# Patient Record
Sex: Male | Born: 1955 | Race: White | Hispanic: No | Marital: Married | Smoking: Never smoker
Health system: Southern US, Community
[De-identification: ages and names within clinical notes are randomized; demographics above are authoritative.]

## PROBLEM LIST (undated history)

## (undated) DIAGNOSIS — I10 Essential (primary) hypertension: Secondary | ICD-10-CM

## (undated) DIAGNOSIS — E785 Hyperlipidemia, unspecified: Secondary | ICD-10-CM

## (undated) DIAGNOSIS — Z9289 Personal history of other medical treatment: Secondary | ICD-10-CM

## (undated) DIAGNOSIS — I4819 Other persistent atrial fibrillation: Secondary | ICD-10-CM

## (undated) HISTORY — DX: Essential (primary) hypertension: I10

## (undated) HISTORY — DX: Other persistent atrial fibrillation: I48.19

## (undated) HISTORY — DX: Personal history of other medical treatment: Z92.89

## (undated) HISTORY — DX: Hyperlipidemia, unspecified: E78.5

---

## 1980-11-08 HISTORY — PX: HERNIA REPAIR: SHX51

## 1999-08-09 ENCOUNTER — Emergency Department (HOSPITAL_COMMUNITY): Admission: EM | Admit: 1999-08-09 | Discharge: 1999-08-09 | Payer: Self-pay | Admitting: Emergency Medicine

## 1999-08-09 ENCOUNTER — Encounter: Payer: Self-pay | Admitting: Emergency Medicine

## 2005-03-08 ENCOUNTER — Ambulatory Visit (HOSPITAL_COMMUNITY): Admission: RE | Admit: 2005-03-08 | Discharge: 2005-03-08 | Payer: Self-pay | Admitting: Chiropractic Medicine

## 2015-12-25 ENCOUNTER — Encounter: Payer: Self-pay | Admitting: Cardiovascular Disease

## 2015-12-25 ENCOUNTER — Ambulatory Visit (INDEPENDENT_AMBULATORY_CARE_PROVIDER_SITE_OTHER): Payer: Commercial Indemnity | Admitting: Cardiovascular Disease

## 2015-12-25 VITALS — BP 160/84 | HR 109 | Ht 73.0 in | Wt 187.6 lb

## 2015-12-25 DIAGNOSIS — I10 Essential (primary) hypertension: Secondary | ICD-10-CM | POA: Diagnosis not present

## 2015-12-25 DIAGNOSIS — Z79899 Other long term (current) drug therapy: Secondary | ICD-10-CM

## 2015-12-25 DIAGNOSIS — I4891 Unspecified atrial fibrillation: Secondary | ICD-10-CM

## 2015-12-25 DIAGNOSIS — E785 Hyperlipidemia, unspecified: Secondary | ICD-10-CM | POA: Insufficient documentation

## 2015-12-25 MED ORDER — LISINOPRIL 5 MG PO TABS: 5.0000 mg | ORAL_TABLET | Freq: Every day | ORAL | Status: DC

## 2015-12-25 MED ORDER — METOPROLOL TARTRATE 25 MG PO TABS: 25.0000 mg | ORAL_TABLET | Freq: Two times a day (BID) | ORAL | Status: DC

## 2015-12-25 MED ORDER — RIVAROXABAN 20 MG PO TABS: 20.0000 mg | ORAL_TABLET | Freq: Every day | ORAL | Status: DC

## 2015-12-25 NOTE — Assessment & Plan Note (Signed)
Barry [name] has newly recognized H of fibrillation. His heart rate is in the low 100 range. He's fairly symptomatic from his however. The CHA2DSVASC2 score is 1  . I'm going to begin him on an oral anticoagulant in anticipation of outpatient cardioversion in approximately 4-6 weeks. We'll get a 2-D echocardiogram as well as a formal neurologic Myoview stress test. I'm going to add a beta blocker for rate control as well.

## 2015-12-25 NOTE — Progress Notes (Signed)
12/25/2015 Barry [name]   06-Jun-1956  [id number]  Primary Physician No primary care provider on file. Primary Cardiologist: Barry Gess MD Barry [name]   HPI:  Barry [name] is a 60 year old mildly overweight divorced Caucasian male father of 2 works at [employer] as an Art gallery manager. He was referred by Dr. Tenny [name] for newly recognized A. Fib with rapid ventricular response and hypertension. He has risk factors for heart disease including 2 hyperlipidemia and family history. He probably did see Dr. Verdis [name] several years ago for routine stress test which was negative. He has never had a heart attack or stroke. He denies chest pain or shortness of breath. He does drink 2 beers a day 5 days a week. He has newly recognized A. Fib and blood pressure which he documented home in the 150/100-110 range. He is not on any antihypertensive medications.   Current Outpatient Prescriptions  Medication Sig Dispense Refill  . aspirin 81 MG tablet Take 81 mg by mouth daily.    Marland Kitchen atorvastatin (LIPITOR) 20 MG tablet Take 20 mg by mouth daily.    Marland Kitchen ibuprofen (ADVIL,MOTRIN) 200 MG tablet Take 200 mg by mouth every 6 (six) hours as needed.     No current facility-administered medications for this visit.    No Known Allergies  Social History   Social History  . Marital Status: Married    Spouse Name: N/A  . Number of Children: N/A  . Years of Education: N/A   Occupational History  . Not on file.   Social History Main Topics  . Smoking status: Never Smoker   . Smokeless tobacco: Not on file  . Alcohol Use: 1.2 oz/week    2 Cans of beer per week  . Drug Use: No  . Sexual Activity: Not on file   Other Topics Concern  . Not on file   Social History Narrative     Review of Systems: General: negative for chills, fever, night sweats or weight changes.  Cardiovascular: negative for chest pain, dyspnea on exertion, edema, orthopnea, palpitations, paroxysmal nocturnal  dyspnea or shortness of breath Dermatological: negative for rash Respiratory: negative for cough or wheezing Urologic: negative for hematuria Abdominal: negative for nausea, vomiting, diarrhea, bright red blood per rectum, melena, or hematemesis Neurologic: negative for visual changes, syncope, or dizziness All other systems reviewed and are otherwise negative except as noted above.    Blood pressure 160/84, pulse 109, height  (1.854 m), weight 187 lb 9.6 oz (85.095 kg).  General appearance: alert and no distress Neck: no adenopathy, no carotid bruit, no JVD, supple, symmetrical, trachea midline and thyroid not enlarged, symmetric, no tenderness/mass/nodules Lungs: clear to auscultation bilaterally Heart: regular rate and rhythm, S1, S2 normal, no murmur, click, rub or gallop Extremities: extremities normal, atraumatic, no cyanosis or edema  EKG H the fibrillation with a ventricular response of 109 and nonspecific ST and T-wave changes. I personally reviewed this EKG  ASSESSMENT AND PLAN:   Essential hypertension History of hypertension blood pressures measured at home in the 140 150/100-120 range. He currently is not on any antihypertensive medications. I'm going to begin him on Lopressor 25 mg by mouth twice a day and lisinopril 5 mg a day. We will check a basic panel in 3 weeks. Unless indicated a blood pressure log and I will see him back in one month for follow-up.  Hyperlipidemia History of hyperlipidemia on atorvastatin with recent lipid profile performed by his PCP 07/31/15 revealed a  total cholesterol of 42, LDL 78 and HDL of 38.  Atrial fibrillation Barry [name]) Barry. [name] has newly recognized H of fibrillation. His heart rate is in the low 100 range. He's fairly symptomatic from his however. The CHA2DSVASC2 score is 1  . I'm going to begin him on an oral anticoagulant in anticipation of outpatient cardioversion in approximately 4-6 weeks. We'll get a 2-D echocardiogram as well  as a formal neurologic Myoview stress test. I'm going to add a beta blocker for rate control as well.      Barry Gess MD FACP,FACC,FAHA, [facility] [name] 12/25/2015 3:32 PM

## 2015-12-25 NOTE — Patient Instructions (Addendum)
Your physician has recommended you make the following change in your medication:   START METOPROLOL TART 25 MG TWICE DAILY  START LISINOPRIL 5 MG DAILY  START XARELTO 20 MG DAILY  STOP ASPIRIN   Your physician has requested that you have an echocardiogram. Echocardiography is a painless test that uses sound waves to create images of your heart. It provides your doctor with information about the size and shape of your heart and how well your heart's chambers and valves are working. This procedure takes approximately one hour. There are no restrictions for this procedure.  Your physician has requested that you have a lexiscan myoview. For further information please visit [email or web address] follow instruction sheet, as given.  Your physician recommends that you return for lab work in: 3 WEEKS AT [facility]  (January 15, 2015).  Your physician recommends that you schedule a follow-up appointment in: ONE MONTH WITH DR. Allyson Sabal

## 2015-12-25 NOTE — Assessment & Plan Note (Signed)
History of hypertension blood pressures measured at home in the 140 150/100-120 range. He currently is not on any antihypertensive medications. I'm going to begin him on Lopressor 25 mg by mouth twice a day and lisinopril 5 mg a day. We will check a basic panel in 3 weeks. Unless indicated a blood pressure log and I will see him back in one month for follow-up.

## 2015-12-25 NOTE — Assessment & Plan Note (Signed)
History of hyperlipidemia on atorvastatin with recent lipid profile performed by his PCP 07/31/15 revealed a total cholesterol of 42, LDL 78 and HDL of 38.

## 2015-12-31 ENCOUNTER — Telehealth (HOSPITAL_COMMUNITY): Payer: Self-pay

## 2015-12-31 NOTE — Telephone Encounter (Signed)
Encounter complete. 

## 2016-01-02 ENCOUNTER — Ambulatory Visit (HOSPITAL_COMMUNITY)
Admission: RE | Admit: 2016-01-02 | Discharge: 2016-01-02 | Disposition: A | Discharge status: Home / Self Care | Payer: Commercial Indemnity | Source: Ambulatory Visit | Attending: Cardiology | Admitting: Cardiology

## 2016-01-02 ENCOUNTER — Ambulatory Visit (HOSPITAL_BASED_OUTPATIENT_CLINIC_OR_DEPARTMENT_OTHER)
Admission: RE | Admit: 2016-01-02 | Discharge: 2016-01-02 | Disposition: A | Discharge status: Home / Self Care | Payer: Commercial Indemnity | Source: Ambulatory Visit | Attending: Cardiology | Admitting: Cardiology

## 2016-01-02 DIAGNOSIS — I4891 Unspecified atrial fibrillation: Secondary | ICD-10-CM | POA: Insufficient documentation

## 2016-01-02 DIAGNOSIS — E785 Hyperlipidemia, unspecified: Secondary | ICD-10-CM | POA: Insufficient documentation

## 2016-01-02 DIAGNOSIS — I071 Rheumatic tricuspid insufficiency: Secondary | ICD-10-CM | POA: Diagnosis not present

## 2016-01-02 DIAGNOSIS — I351 Nonrheumatic aortic (valve) insufficiency: Secondary | ICD-10-CM | POA: Insufficient documentation

## 2016-01-02 DIAGNOSIS — R079 Chest pain, unspecified: Secondary | ICD-10-CM | POA: Insufficient documentation

## 2016-01-02 DIAGNOSIS — R42 Dizziness and giddiness: Secondary | ICD-10-CM | POA: Insufficient documentation

## 2016-01-02 DIAGNOSIS — I119 Hypertensive heart disease without heart failure: Secondary | ICD-10-CM | POA: Diagnosis not present

## 2016-01-02 DIAGNOSIS — R002 Palpitations: Secondary | ICD-10-CM | POA: Insufficient documentation

## 2016-01-02 DIAGNOSIS — I371 Nonrheumatic pulmonary valve insufficiency: Secondary | ICD-10-CM | POA: Diagnosis not present

## 2016-01-02 DIAGNOSIS — R9439 Abnormal result of other cardiovascular function study: Secondary | ICD-10-CM | POA: Diagnosis not present

## 2016-01-02 DIAGNOSIS — I77810 Thoracic aortic ectasia: Secondary | ICD-10-CM | POA: Insufficient documentation

## 2016-01-02 DIAGNOSIS — Z8249 Family history of ischemic heart disease and other diseases of the circulatory system: Secondary | ICD-10-CM | POA: Diagnosis not present

## 2016-01-02 LAB — MYOCARDIAL PERFUSION IMAGING
CHL CUP NUCLEAR SDS: 3
CHL CUP NUCLEAR SSS: 6
CSEPPHR: 130 {beats}/min
LVDIAVOL: 165 mL
LVSYSVOL: 94 mL
Rest HR: 107 {beats}/min
SRS: 3
TID: 1.18

## 2016-01-02 MED ORDER — AMINOPHYLLINE 25 MG/ML IV SOLN: 75.0000 mg | Freq: Once | INTRAVENOUS | Status: DC

## 2016-01-02 MED ORDER — TECHNETIUM TC 99M SESTAMIBI GENERIC - CARDIOLITE: 10.5000 | Freq: Once | INTRAVENOUS | Status: DC | PRN

## 2016-01-02 MED ORDER — REGADENOSON 0.4 MG/5ML IV SOLN: 0.4000 mg | Freq: Once | INTRAVENOUS | Status: DC

## 2016-01-02 MED ORDER — TECHNETIUM TC 99M SESTAMIBI GENERIC - CARDIOLITE: 30.1000 | Freq: Once | INTRAVENOUS | Status: DC | PRN

## 2016-01-08 ENCOUNTER — Encounter: Payer: Self-pay | Admitting: Cardiovascular Disease

## 2016-01-16 LAB — BASIC METABOLIC PANEL
BUN: 26 mg/dL — ABNORMAL HIGH (ref 7–25)
CHLORIDE: 105 mmol/L (ref 98–110)
CO2: 22 mmol/L (ref 20–31)
Calcium: 9.3 mg/dL (ref 8.6–10.3)
Creat: 0.96 mg/dL (ref 0.70–1.33)
GLUCOSE: 127 mg/dL — AB (ref 65–99)
Potassium: 4.6 mmol/L (ref 3.5–5.3)
SODIUM: 138 mmol/L (ref 135–146)

## 2016-01-27 ENCOUNTER — Ambulatory Visit: Payer: Commercial Indemnity | Admitting: Cardiovascular Disease

## 2016-02-13 ENCOUNTER — Encounter: Payer: Self-pay | Admitting: Cardiovascular Disease

## 2016-02-13 ENCOUNTER — Ambulatory Visit (INDEPENDENT_AMBULATORY_CARE_PROVIDER_SITE_OTHER): Payer: Commercial Indemnity | Admitting: Cardiovascular Disease

## 2016-02-13 VITALS — BP 150/78 | HR 70 | Ht 73.0 in | Wt 181.0 lb

## 2016-02-13 DIAGNOSIS — I10 Essential (primary) hypertension: Secondary | ICD-10-CM | POA: Diagnosis not present

## 2016-02-13 DIAGNOSIS — I4819 Other persistent atrial fibrillation: Secondary | ICD-10-CM

## 2016-02-13 DIAGNOSIS — E785 Hyperlipidemia, unspecified: Secondary | ICD-10-CM | POA: Diagnosis not present

## 2016-02-13 DIAGNOSIS — I481 Persistent atrial fibrillation: Secondary | ICD-10-CM

## 2016-02-13 LAB — CBC WITH DIFFERENTIAL/PLATELET
BASOS ABS: 0 {cells}/uL (ref 0–200)
BASOS PCT: 0 %
EOS ABS: 146 {cells}/uL (ref 15–500)
Eosinophils Relative: 2 %
HEMATOCRIT: 45.2 % (ref 38.5–50.0)
Hemoglobin: 15.3 g/dL (ref 13.2–17.1)
LYMPHS PCT: 26 %
Lymphs Abs: 1898 cells/uL (ref 850–3900)
MCH: 29.8 pg (ref 27.0–33.0)
MCHC: 33.8 g/dL (ref 32.0–36.0)
MCV: 88.1 fL (ref 80.0–100.0)
MONO ABS: 584 {cells}/uL (ref 200–950)
MONOS PCT: 8 %
MPV: 10.7 fL (ref 7.5–12.5)
NEUTROS PCT: 64 %
Neutro Abs: 4672 cells/uL (ref 1500–7800)
PLATELETS: 171 10*3/uL (ref 140–400)
RBC: 5.13 MIL/uL (ref 4.20–5.80)
RDW: 13.8 % (ref 11.0–15.0)
WBC: 7.3 10*3/uL (ref 3.8–10.8)

## 2016-02-13 NOTE — Progress Notes (Signed)
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 He was referred by Dr. Duane [name] for newly recognized A. Fib with rapid ventricular response and hypertension. I last saw him in the office 12/25/15 He has risk factors for heart disease including 2 hyperlipidemia and family history. He probably did see Dr. Verdis [name] several years ago for routine stress test which was negative. He has never had a heart attack or stroke. He denies chest pain or shortness of breath. He does drink 2 beers a day 5 days a week. He has cut down his alcohol intake since his last office visit.He has newly recognized A. Fib and blood pressure which he documented home in the 150/100-110 range. I began him on metoprolol and lisinopril at his last office visit for rate control and blood pressure control. He does feel clinically improved. His blood pressure is improved as well as heart rate has come down to the 70 range. He has been on xarelto for the last 6-7 weeks at my request and anticipation of elective outpatient cardioversion. A Myoview stress test was low risk and a 2-D echo revealed normal LV systolic function, concentric left hypertrophy and severe left atrial enlargement making the possibility of cardioversion more difficult.   Current Outpatient Prescriptions  Medication Sig Dispense Refill  . atorvastatin (LIPITOR) 20 MG tablet Take 20 mg by mouth daily.    Marland Kitchen ibuprofen (ADVIL,MOTRIN) 200 MG tablet Take 200 mg by mouth every 6 (six) hours as needed.    Marland Kitchen lisinopril (PRINIVIL,ZESTRIL) 5 MG tablet Take 1 tablet (5 mg total) by mouth daily. 30 tablet 5  . metoprolol tartrate (LOPRESSOR) 25 MG tablet Take 1 tablet (25 mg total) by mouth 2 (two) times daily. 60 tablet 5  .  rivaroxaban (XARELTO) 20 MG TABS tablet Take 1 tablet (20 mg total) by mouth daily with supper. 30 tablet 5   No current facility-administered medications for this visit.    No Known Allergies  Social History   Social History  . Marital Status: Married    Spouse Name: N/A  . Number of Children: N/A  . Years of Education: N/A   Occupational History  . Not on file.   Social History Main Topics  . Smoking status: Never Smoker   . Smokeless tobacco: Not on file  . Alcohol Use: 1.2 oz/week    2 Cans of beer per week  . Drug Use: No  . Sexual Activity: Not on file   Other Topics Concern  . Not on file   Social History Narrative     Review of Systems: General: negative for chills, fever, night sweats or weight changes.  Cardiovascular: negative for chest pain, dyspnea on exertion, edema, orthopnea, palpitations, paroxysmal nocturnal dyspnea or shortness of breath Dermatological: negative for rash Respiratory: negative for cough or wheezing Urologic: negative for hematuria Abdominal: negative for nausea, vomiting, diarrhea, bright red blood per rectum, melena, or hematemesis Neurologic: negative for visual changes, syncope, or dizziness All other systems reviewed and are otherwise negative except as noted above.    Blood pressure 150/78, pulse 70, height  (1.854 m), weight 181 lb (82.101 kg).  General appearance: alert and no distress Neck: no adenopathy, no carotid bruit, no JVD, supple, symmetrical, trachea  midline and thyroid not enlarged, symmetric, no tenderness/mass/nodules Lungs: clear to auscultation bilaterally Heart: regular rate and rhythm, S1, S2 normal, no murmur, click, rub or gallop Extremities: extremities normal, atraumatic, no cyanosis or edema  EKG not performed today  ASSESSMENT AND PLAN:   Essential hypertension History of hypertension with blood pressure measured at 150/78 although at home when he measures it himself at slightly lower than  this. He is on metoprolol and lisinopril. Continue current meds at current dosing  Hyperlipidemia History of hyperlipidemia on statin therapy followed by his PCP  Atrial fibrillation (HCC) History of persistent atrial fibrillation first demonstrated approximately 2 months ago. Here a 2-D echo that showed moderate concentric left ventricle. Hypertrophy with severe L a dilatation. A Myoview stress test was low risk. He has been on  Xarelto  oral anticoagulation for the  last 6-7 weeks. I'm going to arrange for him to undergo outpatient DC cardioversion. He is relatively asymptomatic from his A. Fib. If he does not maintain sinus rhythm after successful cardioversion settle for rate control.      Barry GessJonathan J. Geordie Nooney MD FACP,FACC,FAHA, [facility] 02/13/2016 10:09 AM

## 2016-02-13 NOTE — Assessment & Plan Note (Signed)
History of hypertension with blood pressure measured at 150/78 although at home when he measures it himself at slightly lower than this. He is on metoprolol and lisinopril. Continue current meds at current dosing

## 2016-02-13 NOTE — Assessment & Plan Note (Signed)
History of hyperlipidemia on statin therapy followed by his PCP 

## 2016-02-13 NOTE — Patient Instructions (Signed)
Medication Instructions:  Your physician recommends that you continue on your current medications as directed. Please refer to the Current Medication list given to you today.     Testing/Procedures: Your physician has recommended that you have a Cardioversion (DCCV). Electrical Cardioversion uses a jolt of electricity to your heart either through paddles or wired patches attached to your chest. This is a controlled, usually prescheduled, procedure. Defibrillation is done under light anesthesia in the hospital, and you usually go home the day of the procedure. This is done to get your heart back into a normal rhythm. You are not awake for the procedure. Please see the instruction sheet given to you today. SCHEDULE WITH DR. Elease HashimotoNAHSER ON 4/12    Follow-Up: Your physician recommends that you schedule a follow-up appointment in: 4 WEEKS POST CARDIOVERSION WITH DR. Allyson SabalBERRY    Any Other Special Instructions Will Be Listed Below (If Applicable).     If you need a refill on your cardiac medications before your next appointment, please call your pharmacy.

## 2016-02-13 NOTE — Assessment & Plan Note (Signed)
History of persistent atrial fibrillation first demonstrated approximately 2 months ago. Here a 2-D echo that showed moderate concentric left ventricle. Hypertrophy with severe L a dilatation. A Myoview stress test was low risk. He has been on  Xarelto  oral anticoagulation for the  last 6-7 weeks. I'm going to arrange for him to undergo outpatient DC cardioversion. He is relatively asymptomatic from his A. Fib. If he does not maintain sinus rhythm after successful cardioversion settle for rate control.

## 2016-02-14 LAB — URINALYSIS
BILIRUBIN URINE: NEGATIVE
GLUCOSE, UA: NEGATIVE
HGB URINE DIPSTICK: NEGATIVE
Ketones, ur: NEGATIVE
LEUKOCYTES UA: NEGATIVE
Nitrite: NEGATIVE
PH: 5 (ref 5.0–8.0)
PROTEIN: NEGATIVE
Specific Gravity, Urine: 1.027 (ref 1.001–1.035)

## 2016-02-18 ENCOUNTER — Encounter (HOSPITAL_COMMUNITY): Payer: Self-pay | Admitting: *Deleted

## 2016-02-18 ENCOUNTER — Encounter (HOSPITAL_COMMUNITY)
Admission: RE | Disposition: A | Discharge status: Home / Self Care | Payer: Self-pay | Source: Ambulatory Visit | Attending: Cardiovascular Disease

## 2016-02-18 ENCOUNTER — Ambulatory Visit (HOSPITAL_COMMUNITY)
Admission: RE | Admit: 2016-02-18 | Discharge: 2016-02-18 | Disposition: A | Discharge status: Home / Self Care | Payer: Managed Care, Other (non HMO) | Source: Ambulatory Visit | Attending: Cardiovascular Disease | Admitting: Cardiovascular Disease

## 2016-02-18 ENCOUNTER — Ambulatory Visit (HOSPITAL_COMMUNITY): Payer: Managed Care, Other (non HMO) | Admitting: Certified Registered"

## 2016-02-18 DIAGNOSIS — I10 Essential (primary) hypertension: Secondary | ICD-10-CM | POA: Diagnosis not present

## 2016-02-18 DIAGNOSIS — I481 Persistent atrial fibrillation: Secondary | ICD-10-CM

## 2016-02-18 DIAGNOSIS — Z7901 Long term (current) use of anticoagulants: Secondary | ICD-10-CM | POA: Insufficient documentation

## 2016-02-18 DIAGNOSIS — E785 Hyperlipidemia, unspecified: Secondary | ICD-10-CM | POA: Diagnosis not present

## 2016-02-18 DIAGNOSIS — I4819 Other persistent atrial fibrillation: Secondary | ICD-10-CM | POA: Insufficient documentation

## 2016-02-18 DIAGNOSIS — I4891 Unspecified atrial fibrillation: Secondary | ICD-10-CM | POA: Diagnosis present

## 2016-02-18 HISTORY — PX: CARDIOVERSION: SHX1299

## 2016-02-18 SURGERY — CARDIOVERSION
Anesthesia: General

## 2016-02-18 MED ORDER — LIDOCAINE HCL (CARDIAC) 20 MG/ML IV SOLN
INTRAVENOUS | Status: DC | PRN
  Administered 2016-02-18: 20 mg via INTRAVENOUS

## 2016-02-18 MED ORDER — SODIUM CHLORIDE 0.9 % IV SOLN
INTRAVENOUS | Status: DC
Start: 2016-02-18 — End: 2016-02-18
  Administered 2016-02-18: 500 mL via INTRAVENOUS

## 2016-02-18 MED ORDER — LIDOCAINE HCL (CARDIAC) 20 MG/ML IV SOLN
INTRAVENOUS | Status: AC
  Filled 2016-02-18: qty 5

## 2016-02-18 MED ORDER — PROPOFOL 10 MG/ML IV BOLUS
INTRAVENOUS | Status: DC | PRN
  Administered 2016-02-18: 110 mg via INTRAVENOUS

## 2016-02-18 MED ORDER — PROPOFOL 10 MG/ML IV BOLUS
INTRAVENOUS | Status: AC
  Filled 2016-02-18: qty 20

## 2016-02-18 NOTE — Discharge Instructions (Signed)
Electrical Cardioversion, Care After °Refer to this sheet in the next few weeks. These instructions provide you with information on caring for yourself after your procedure. Your health care provider may also give you more specific instructions. Your treatment has been planned according to current medical practices, but problems sometimes occur. Call your health care provider if you have any problems or questions after your procedure. °WHAT TO EXPECT AFTER THE PROCEDURE °After your procedure, it is typical to have the following sensations: °· Some redness on the skin where the shocks were delivered. If this is tender, a sunburn lotion or hydrocortisone cream may help. °· Possible return of an abnormal heart rhythm within hours or days after the procedure. °HOME CARE INSTRUCTIONS °· Take medicines only as directed by your health care provider. Be sure you understand how and when to take your medicine. °· Learn how to feel your pulse and check it often. °· Limit your activity for 48 hours after the procedure or as directed by your health care provider. °· Avoid or minimize caffeine and other stimulants as directed by your health care provider. °SEEK MEDICAL CARE IF: °· You feel like your heart is beating too fast or your pulse is not regular. °· You have any questions about your medicines. °· You have bleeding that will not stop. °SEEK IMMEDIATE MEDICAL CARE IF: °· You are dizzy or feel faint. °· It is hard to breathe or you feel short of breath. °· There is a change in discomfort in your chest. °· Your speech is slurred or you have trouble moving an arm or leg on one side of your body. °· You get a serious muscle cramp that does not go away. °· Your fingers or toes turn cold or blue. °  °This information is not intended to replace advice given to you by your health care provider. Make sure you discuss any questions you have with your health care provider. °  °Document Released: 08/15/2013 Document Revised: 11/15/2014  Document Reviewed: 08/15/2013 °Elsevier Interactive Patient Education ©2016 [employer] ° °

## 2016-02-18 NOTE — Interval H&P Note (Signed)
History and Physical Interval Note:  02/18/2016 12:02 PM  Barry [name]  has presented today for surgery, with the diagnosis of afib  The various methods of treatment have been discussed with the patient and family. After consideration of risks, benefits and other options for treatment, the patient has consented to  Procedure(s): CARDIOVERSION (N/A) as a surgical intervention .  The patient's history has been reviewed, patient examined, [name] change in status, stable for surgery.  I have reviewed the patient's chart and labs.  Questions were answered to the patient's satisfaction.     Jordanny Waddington, Deloris PingPhilip J

## 2016-02-18 NOTE — Anesthesia Postprocedure Evaluation (Signed)
Anesthesia Post Note  Patient: Barry [name]  Procedure(s) Performed: Procedure(s) (LRB): CARDIOVERSION (N/A)  Patient location during evaluation: Endoscopy Anesthesia Type: General Level of consciousness: awake, awake and alert and oriented Pain management: pain level controlled Vital Signs Assessment: post-procedure vital signs reviewed and stable Respiratory status: spontaneous breathing, nonlabored ventilation and respiratory function stable Anesthetic complications: no    Last Vitals:  Filed Vitals:   02/18/16 1230 02/18/16 1240  BP: 131/71 115/76  Pulse: 74 66  Temp:    Resp: 13 19    Last Pain: There were no vitals filed for this visit.               Kynzee Devinney COKER

## 2016-02-18 NOTE — Transfer of Care (Signed)
Immediate Anesthesia Transfer of Care Note  Patient: Barry [name]  Procedure(s) Performed: Procedure(s): CARDIOVERSION (N/A)  Patient Location: Endoscopy Unit  Anesthesia Type:General  Level of Consciousness: awake, oriented and patient cooperative  Airway & Oxygen Therapy: Patient Spontanous Breathing and Patient connected to nasal cannula oxygen  Post-op Assessment: Report given to RN, Post -op Vital signs reviewed and stable and Patient moving all extremities  Post vital signs: Reviewed and stable  Last Vitals:  Filed Vitals:   02/18/16 1127  BP: 134/94  Temp: 36.5 C  Resp: 13    Complications: No apparent anesthesia complications

## 2016-02-18 NOTE — CV Procedure (Signed)
    Cardioversion Note  Chuck HintMark B Kerins [id number] 01/09/1956  Procedure: DC Cardioversion Indications: atrial fib   Procedure Details Consent: Obtained Time Out: Verified patient identification, verified procedure, site/side was marked, verified correct patient position, special equipment/implants available, Radiology Safety Procedures followed,  medications/allergies/relevent history reviewed, required imaging and test results available.  Performed  The patient has been on adequate anticoagulation.  The patient received IV Lidocaine 20 mg followed by Propofol 110 mg IV  for sedation.  Synchronous cardioversion was performed at 120  joules.  The cardioversion was successful     Complications: No apparent complications Patient did tolerate procedure well.   Vesta MixerPhilip J. Xan Ingraham, Montez HagemanJr., MD, [facility] 02/18/2016, 12:13 PM

## 2016-02-18 NOTE — H&P (View-Only) (Signed)
   02/13/2016 Barry [name]   07/16/1956  [id number]  Primary Physician  Ross, Alan, MD Primary Cardiologist: Kazaria Gaertner J. 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 He was referred by Dr. Alan Ross for newly recognized A. Fib with rapid ventricular response and hypertension. I last saw him in the office 12/25/15 He has risk factors for heart disease including 2 hyperlipidemia and family history. He probably did see Dr. Henry Smith several years ago for routine stress test which was negative. He has never had a heart attack or stroke. He denies chest pain or shortness of breath. He does drink 2 beers a day 5 days a week. He has cut down his alcohol intake since his last office visit.He has newly recognized A. Fib and blood pressure which he documented home in the 150/100-110 range. I began him on metoprolol and lisinopril at his last office visit for rate control and blood pressure control. He does feel clinically improved. His blood pressure is improved as well as heart rate has come down to the 70 range. He has been on xarelto for the last 6-7 weeks at my request and anticipation of elective outpatient cardioversion. A Myoview stress test was low risk and a 2-D echo revealed normal LV systolic function, concentric left hypertrophy and severe left atrial enlargement making the possibility of cardioversion more difficult.   Current Outpatient Prescriptions  Medication Sig Dispense Refill  . atorvastatin (LIPITOR) 20 MG tablet Take 20 mg by mouth daily.    . ibuprofen (ADVIL,MOTRIN) 200 MG tablet Take 200 mg by mouth every 6 (six) hours as needed.    . lisinopril (PRINIVIL,ZESTRIL) 5 MG tablet Take 1 tablet (5 mg total) by mouth daily. 30 tablet 5  . metoprolol tartrate (LOPRESSOR) 25 MG tablet Take 1 tablet (25 mg total) by mouth 2 (two) times daily. 60 tablet 5  .  rivaroxaban (XARELTO) 20 MG TABS tablet Take 1 tablet (20 mg total) by mouth daily with supper. 30 tablet 5   No current facility-administered medications for this visit.    No Known Allergies  Social History   Social History  . Marital Status: Married    Spouse Name: N/A  . Number of Children: N/A  . Years of Education: N/A   Occupational History  . Not on file.   Social History Main Topics  . Smoking status: Never Smoker   . Smokeless tobacco: Not on file  . Alcohol Use: 1.2 oz/week    2 Cans of beer per week  . Drug Use: No  . Sexual Activity: Not on file   Other Topics Concern  . Not on file   Social History Narrative     Review of Systems: General: negative for chills, fever, night sweats or weight changes.  Cardiovascular: negative for chest pain, dyspnea on exertion, edema, orthopnea, palpitations, paroxysmal nocturnal dyspnea or shortness of breath Dermatological: negative for rash Respiratory: negative for cough or wheezing Urologic: negative for hematuria Abdominal: negative for nausea, vomiting, diarrhea, bright red blood per rectum, melena, or hematemesis Neurologic: negative for visual changes, syncope, or dizziness All other systems reviewed and are otherwise negative except as noted above.    Blood pressure 150/78, pulse 70, height 6' 1" (1.854 m), weight 181 lb (82.101 kg).  General appearance: alert and no distress Neck: no adenopathy, no carotid bruit, no JVD, supple, symmetrical, trachea   midline and thyroid not enlarged, symmetric, no tenderness/mass/nodules Lungs: clear to auscultation bilaterally Heart: regular rate and rhythm, S1, S2 normal, no murmur, click, rub or gallop Extremities: extremities normal, atraumatic, no cyanosis or edema  EKG not performed today  ASSESSMENT AND PLAN:   Essential hypertension History of hypertension with blood pressure measured at 150/78 although at home when he measures it himself at slightly lower than  this. He is on metoprolol and lisinopril. Continue current meds at current dosing  Hyperlipidemia History of hyperlipidemia on statin therapy followed by his PCP  Atrial fibrillation (HCC) History of persistent atrial fibrillation first demonstrated approximately 2 months ago. Here a 2-D echo that showed moderate concentric left ventricle. Hypertrophy with severe L a dilatation. A Myoview stress test was low risk. He has been on  Xarelto  oral anticoagulation for the  last 6-7 weeks. I'm going to arrange for him to undergo outpatient DC cardioversion. He is relatively asymptomatic from his A. Fib. If he does not maintain sinus rhythm after successful cardioversion settle for rate control.      Alasia Enge J. Teasia Zapf MD FACP,FACC,FAHA, FSCAI 02/13/2016 10:09 AM  

## 2016-02-18 NOTE — Anesthesia Preprocedure Evaluation (Addendum)
Anesthesia Evaluation  Patient identified by MRN, date of birth, ID band Patient awake    Reviewed: Allergy & Precautions, NPO status , Patient's Chart, lab work & pertinent test results  Airway Mallampati: II  TM Distance: >3 FB Neck ROM: Full    Dental  (+) Teeth Intact, Dental Advisory Given   Pulmonary    breath sounds clear to auscultation       Cardiovascular hypertension,  Rhythm:Irregular Rate:Normal     Neuro/Psych    GI/Hepatic   Endo/Other    Renal/GU      Musculoskeletal   Abdominal   Peds  Hematology   Anesthesia Other Findings   Reproductive/Obstetrics                            Anesthesia Physical Anesthesia Plan  ASA: III  Anesthesia Plan: General   Post-op Pain Management:    Induction: Intravenous  Airway Management Planned: Mask  Additional Equipment: None  Intra-op Plan:   Post-operative Plan:   Informed Consent: I have reviewed the patients History and Physical, chart, labs and discussed the procedure including the risks, benefits and alternatives for the proposed anesthesia with the patient or authorized representative who has indicated his/her understanding and acceptance.   Dental advisory given  Plan Discussed with: CRNA, Anesthesiologist and Surgeon  Anesthesia Plan Comments:         Anesthesia Quick Evaluation

## 2016-02-19 ENCOUNTER — Encounter (HOSPITAL_COMMUNITY): Payer: Self-pay | Admitting: Cardiovascular Disease

## 2016-04-21 ENCOUNTER — Ambulatory Visit (INDEPENDENT_AMBULATORY_CARE_PROVIDER_SITE_OTHER): Payer: Managed Care, Other (non HMO) | Admitting: Nurse Practitioner

## 2016-04-21 ENCOUNTER — Encounter: Payer: Self-pay | Admitting: Nurse Practitioner

## 2016-04-21 VITALS — BP 120/82 | HR 91 | Ht 73.0 in | Wt 185.0 lb

## 2016-04-21 DIAGNOSIS — I10 Essential (primary) hypertension: Secondary | ICD-10-CM | POA: Diagnosis not present

## 2016-04-21 DIAGNOSIS — E785 Hyperlipidemia, unspecified: Secondary | ICD-10-CM

## 2016-04-21 DIAGNOSIS — I481 Persistent atrial fibrillation: Secondary | ICD-10-CM | POA: Diagnosis not present

## 2016-04-21 DIAGNOSIS — I4819 Other persistent atrial fibrillation: Secondary | ICD-10-CM

## 2016-04-21 NOTE — Patient Instructions (Signed)
You have been referred to: [facility] with Dr Elberta Fortisamnitz

## 2016-04-21 NOTE — Progress Notes (Signed)
Office Visit    Patient Name: Barry [name] Date of Encounter: 04/21/2016  Primary Care Provider:   Duane Lopeoss, Alan, MD Primary Cardiologist:  Erlene QuanJ. Berry, MD   Chief Complaint    60 year old male with a history of atrial fibrillation who presents for follow-up after recent cardioversion.  Past Medical History    Past Medical History  Diagnosis Date  . Hyperlipidemia   . Essential hypertension   . Persistent atrial fibrillation (HCC)     a. Dx 12/2012; b. CHA2DS2VASc = 1-->Xarelto;  c. 12/2015 Echo: EF 50-55%, no rwma, mild AI, mildly dil Asc AO, triv MR/TR, sev dil LA, mild PI;  d. 02/2016 Successful DCCV w/ [id number]; e. 04/2016 Recurrent AF.  Marland Kitchen. H/O cardiovascular stress test     a. 12/2015 MV: EF 43% (nl by echo), small basal & mid inferoseptal defect - likely attenuation artifact.   Past Surgical History  Procedure Laterality Date  . Hernia repair  1982  . Cardioversion N/A 02/18/2016    Procedure: CARDIOVERSION;  Surgeon: Vesta MixerPhilip J Nahser, MD;  [facility];  Service: Cardiovascular;  Laterality: N/A;    Allergies  No Known Allergies  History of Present Illness    60 year old male with a history of hypertension and hyperlipidemia. He was diagnosed with atrial fibrillation in February of this year, when it was incidentally picked up on a routine exam. He was subsequent seen by Dr. Allyson SabalBerry and underwent echocardiogram and stress testing, both of which were normal. He was placed on Xarelto and subsequently underwent cardioversion in April, which was successful. For roughly a month and a half, he felt quite well with no recurrence of palpitations. He says in late May, he was playing soccer with his kids and felt more short of breath than he would've expected to be. In that setting, he checked his blood pressure and saw that his heart rates were in the 90s to low 100s and also irregular. His rates have remained in the 90s and irregular ever since then. He does think he has had some  decrease in overall exercise tolerance while in atrial fibrillation. He denies chest pain, PND, orthopnea, dizziness, syncope, edema, or early satiety. He is interested in pursuing restoration of sinus rhythm through either antiarrhythmic or ablative therapy.  Home Medications    Prior to Admission medications   Medication Sig Start Date End Date Taking? Authorizing Provider  atorvastatin (LIPITOR) 20 MG tablet Take 20 mg by mouth daily.   Yes Historical Provider, MD  ibuprofen (ADVIL,MOTRIN) 200 MG tablet Take 200 mg by mouth every 6 (six) hours as needed.   Yes Historical Provider, MD  lisinopril (PRINIVIL,ZESTRIL) 5 MG tablet Take 1 tablet (5 mg total) by mouth daily. 12/25/15  Yes Runell GessJonathan J Berry, MD  metoprolol tartrate (LOPRESSOR) 25 MG tablet Take 1 tablet (25 mg total) by mouth 2 (two) times daily. 12/25/15  Yes Runell GessJonathan J Berry, MD  rivaroxaban (XARELTO) 20 MG TABS tablet Take 1 tablet (20 mg total) by mouth daily with supper. 12/25/15  Yes Runell GessJonathan J Berry, MD    Review of Systems    As above, he has had some drop in excess tolerance since going back and A. fib in late May. He also has noticed palpitations if he lays on his left side at night. He denies chest pain, PND, orthopnea, dizziness, syncope, edema, or early satiety.  All other systems reviewed and are otherwise negative except as noted above.  Physical Exam    VS:  BP 120/82 mmHg  Pulse 91  Ht  (1.854 m)  Wt 185 lb (83.915 kg)  BMI 24.41 kg/m2 , BMI Body mass index is 24.41 kg/(m^2). GEN: Well nourished, well developed, in no acute distress. HEENT: normal. Neck: Supple, no JVD, carotid bruits, or masses. Cardiac: Irregularly, irregular , no murmurs, rubs, or gallops. No clubbing, cyanosis, edema.  Radials/DP/PT 2+ and equal bilaterally.  Respiratory:  Respirations regular and unlabored, clear to auscultation bilaterally. GI: Soft, nontender, nondistended, BS + x 4. MS: no deformity or atrophy. Skin: warm and dry,  no rash. Neuro:  Strength and sensation are intact. Psych: Normal affect.  Accessory Clinical Findings    ECG - Atrial fibrillation, 91, nonspecific ST-T changes.  Assessment & Plan    1.  Persistent atrial fibrillation: This was first diagnosed in February with an unknown duration at that time. He was initially asymptomatic. He subsequently underwent echo and stress testing which were normal following by successful cardioversion. He has been anticoagulated on Xarelto in the setting of a CHA2DS2VASc of 1. In late May, he noted recurrence of palpitations with elevated resting heart rates in the 90s to low 100s. He has noticed a reduction in exercise tolerance which she says was not there previously. He is interested in pursuing restoration of sinus rhythm through either antiarrhythmic or ablative therapy. I have discussed his case with Dr. Allyson Sabal. He would likely be a reasonable candidate for either flecainide or tikosyn, though he is interested in pursuing ablation if it is deemed an option (sev dil LA on echo).  We will refer to Dr. [facility] to discuss options.  2. Essential hypertension: Stable on beta blocker and ACE inhibitor therapy.  3. Hyperlipidemia: He is on statin therapy and this is followed by his primary care provider. Next  4. Disposition: Follow-up with electrophysiology for consideration of antiarrhythmic versus ablation for atrial fibrillation as soon as possible.  Nicolasa Ducking, NP 04/21/2016, 10:53 AM

## 2016-05-09 NOTE — Progress Notes (Addendum)
Electrophysiology Office Note   Date:  05/10/2016   ID:  Barry HintMark B Kelley, DOB 05/01/1956, MRN [id number]  PCP:   Duane Lopeoss, Alan, MD  Cardiologist:  Allyson SabalBerry Primary Electrophysiologist:  Delailah Spieth Jorja LoaMartin Khriz Liddy, MD    Chief Complaint  Patient presents with  . New Patient (Initial Visit)  . Atrial Fibrillation     History of Present Illness: Barry [name] is a 60 y.o. male who presents today for electrophysiology evaluation.   History of atrial fibrillation, HTN, HLD.  Diagnosed with AF in February.  TTE and stress testing normal.  On Xarelto with cardioversion in April.  In May, playing soccer with his kids, became SOB, and noted to have an irregular HR on monitor.  Has had decreased exercise tolerance.  Side from the fatigue and palpitations, he feels well.   Today, he denies symptoms of chest pain, shortness of breath, orthopnea, PND, lower extremity edema, claudication, dizziness, presyncope, syncope, bleeding, or neurologic sequela. The patient is tolerating medications without difficulties and is otherwise without complaint today.    Past Medical History  Diagnosis Date  . Hyperlipidemia   . Essential hypertension   . Persistent atrial fibrillation (HCC)     a. Dx 12/2012; b. CHA2DS2VASc = 1-->Xarelto;  c. 12/2015 Echo: EF 50-55%, no rwma, mild AI, mildly dil Asc AO, triv MR/TR, sev dil LA, mild PI;  d. 02/2016 Successful DCCV w/ [id number]; e. 04/2016 Recurrent AF.  Marland Kitchen. H/O cardiovascular stress test     a. 12/2015 MV: EF 43% (nl by echo), small basal & mid inferoseptal defect - likely attenuation artifact.   Past Surgical History  Procedure Laterality Date  . Hernia repair  1982  . Cardioversion N/A 02/18/2016    Procedure: CARDIOVERSION;  Surgeon: Vesta MixerPhilip J Nahser, MD;  [facility];  Service: Cardiovascular;  Laterality: N/A;     Current Outpatient Prescriptions  Medication Sig Dispense Refill  . atorvastatin (LIPITOR) 20 MG tablet Take 20 mg by mouth daily.    Marland Kitchen. ibuprofen  (ADVIL,MOTRIN) 200 MG tablet Take 200 mg by mouth every 6 (six) hours as needed.    Marland Kitchen. lisinopril (PRINIVIL,ZESTRIL) 5 MG tablet Take 1 tablet (5 mg total) by mouth daily. 30 tablet 5  . metoprolol tartrate (LOPRESSOR) 25 MG tablet Take 1 tablet (25 mg total) by mouth 2 (two) times daily. 60 tablet 5  . rivaroxaban (XARELTO) 20 MG TABS tablet Take 1 tablet (20 mg total) by mouth daily with supper. 30 tablet 5   No current facility-administered medications for this visit.    Allergies:   Review of patient's allergies indicates no known allergies.   Social History:  The patient  reports that he has never smoked. He does not have any smokeless tobacco history on file. He reports that he drinks about 1.2 oz of alcohol per week. He reports that he does not use illicit drugs.   Family History:  The patient's family history includes Heart attack in his father and mother; Hyperlipidemia in his father; Hypertension in his father and mother.    ROS:  Please see the history of present illness.   Otherwise, review of systems is positive for skipped beats.   All other systems are reviewed and negative.    PHYSICAL EXAM: VS:  BP 114/78 mmHg  Pulse 85  Ht 6\' 1"  (1.854 m)  Wt 183 lb 9.6 oz (83.28 kg)  BMI 24.23 kg/m2 , BMI Body mass index is 24.23 kg/(m^2). GEN: Well nourished, well developed, in no  acute distress HEENT: normal Neck: no JVD, carotid bruits, or masses Cardiac: irregular rhythm; no murmurs, rubs, or gallops,no edema  Respiratory:  clear to auscultation bilaterally, normal work of breathing GI: soft, nontender, nondistended, + BS MS: no deformity or atrophy Skin: warm and dry,  euro:  Strength and sensation are intact Psych: euthymic mood, full affect  EKG:  EKG is ordered today. The ekg ordered today shows atrial fibrillation, rate 85, voltage criteria for LVH   Recent Labs: 01/16/2016: BUN 26*; Creat 0.96; Potassium 4.6; Sodium 138 02/13/2016: Hemoglobin 15.3; Platelets 171     Lipid Panel  No results found for: CHOL, TRIG, HDL, CHOLHDL, VLDL, LDLCALC, LDLDIRECT   Wt Readings from Last 3 Encounters:  05/10/16 183 lb 9.6 oz (83.28 kg)  04/21/16 185 lb (83.915 kg)  02/13/16 181 lb (82.101 kg)      Other studies Reviewed: Additional studies/ records that were reviewed today include: TTE 01/02/16 - Left ventricle: The cavity size was normal. There was moderate  concentric hypertrophy. Systolic function was normal. The  estimated ejection fraction was in the range of 50% to 55%. Wall  motion was normal; there were no regional wall motion  abnormalities. - Aortic valve: Transvalvular velocity was within the normal range.  There was no stenosis. There was mild regurgitation. - Aorta: Ascending aorta diameter: 38 mm (ED). - Ascending aorta: The ascending aorta was mildly dilated. - Mitral valve: Transvalvular velocity was within the normal range.  There was no evidence for stenosis. There was trivial  regurgitation. - Left atrium: The atrium was severely dilated. - Right ventricle: Systolic function was normal. - Tricuspid valve: There was trivial regurgitation. - Pulmonic valve: There was mild regurgitation. - Inferior vena cava: The vessel was normal in size. The  respirophasic diameter changes were in the normal range (>= 50%),  consistent with normal central venous pressure.  Myoview 01/02/16   The left ventricular ejection fraction is moderately decreased (30-44%).  Nuclear stress EF is calculated at 43% but visually appears higher.  There was no ST segment deviation noted during stress.  There is a small defect of mild severity present in the basal inferoseptal and mid inferoseptal location. The defect is non-reversible. There is a small defect of mild severity present in the basal inferior and mid inferior location. The defect is non-reversible. Defects are most consistent with diaphragmatic attenuation. No ischemia  noted.   ASSESSMENT AND PLAN:  1.  Persistent atrial fibrillation: On Xarelto.  I discussed with him further options of treatment including medical management versus ablation. At this time he does not feel like ablation is a good choice. We'll therefore start him on flecainide 100 mg twice daily. We Kaia Depaolis get a stress test one week. If he remains in atrial fibrillation at the time of the stress test, Nonna Renninger plan for cardioversion at that time.  This patients CHA2DS2-VASc Score and unadjusted Ischemic Stroke Rate (% per year) is equal to 0.6 % stroke rate/year from a score of 1  Above score calculated as 1 point each if present [CHF, HTN, DM, Vascular=MI/PAD/Aortic Plaque, Age if 65-74, or Male] Above score calculated as 2 points each if present [Age > 75, or Stroke/TIA/TE]   Current medicines are reviewed at length with the patient today.   The patient does not have concerns regarding his medicines.  The following changes were made today:  Flecainide 100 mg  Labs/ tests ordered today include:  No orders of the defined types were placed in this encounter.  Disposition:   FU with Antwyne Pingree 3 months  Signed, Chanti Golubski Jorja LoaMartin Mackynzie Woolford, MD  05/10/2016 9:05 AM     [facility] [address] [place] [address] [phone] (office) [phone] (fax)

## 2016-05-10 ENCOUNTER — Encounter: Payer: Self-pay | Admitting: Cardiology

## 2016-05-10 ENCOUNTER — Ambulatory Visit (INDEPENDENT_AMBULATORY_CARE_PROVIDER_SITE_OTHER): Payer: Managed Care, Other (non HMO) | Admitting: Cardiology

## 2016-05-10 VITALS — BP 114/78 | HR 85 | Ht 73.0 in | Wt 183.6 lb

## 2016-05-10 DIAGNOSIS — I481 Persistent atrial fibrillation: Secondary | ICD-10-CM | POA: Diagnosis not present

## 2016-05-10 DIAGNOSIS — I4819 Other persistent atrial fibrillation: Secondary | ICD-10-CM

## 2016-05-10 MED ORDER — FLECAINIDE ACETATE 100 MG PO TABS: 100.0000 mg | ORAL_TABLET | Freq: Two times a day (BID) | ORAL | Status: DC

## 2016-05-10 NOTE — Patient Instructions (Addendum)
Medication Instructions:  Your physician has recommended you make the following change in your medication:  1) START Flecainide 100 mg twice a day  (do not start this until Emmalena Canny, RN calls you with a date to start taking this)  Labwork: None ordered  Testing/Procedures: Your physician has requested that you have an exercise tolerance test (needs to be at least 10 working days from today). For further information please visit [email or web address]. Please also follow instruction sheet, as given.  Follow-Up: Your physician recommends that you schedule a follow-up appointment in: 3 months with Dr. Elberta Fortisamnitz.  If you need a refill on your cardiac medications before your next appointment, please call your pharmacy.  Thank you for choosing [facility]!!   Dory HornSherri Brixton Schnapp, RN [phone]  Any Other Special Instructions Will Be Listed Below (If Applicable). Flecainide tablets What is this medicine? FLECAINIDE (FLEK a nide) is an antiarrhythmic drug. This medicine is used to prevent irregular heart rhythm. It can also slow down fast heartbeats called tachycardia. This medicine may be used for other purposes; ask your health care provider or pharmacist if you have questions. What should I tell my health care provider before I take this medicine? They need to know if you have any of these conditions: -abnormal levels of potassium in the blood -heart disease including heart rhythm and heart rate problems -kidney or liver disease -recent heart attack -an unusual or allergic reaction to flecainide, local anesthetics, other medicines, foods, dyes, or preservatives -pregnant or trying to get pregnant -breast-feeding How should I use this medicine? Take this medicine by mouth with a glass of water. Follow the directions on the prescription label. You can take this medicine with or without food. Take your doses at regular intervals. Do not take your medicine more often than directed. Do not stop  taking this medicine suddenly. This may cause serious, heart-related side effects. If your doctor wants you to stop the medicine, the dose may be slowly lowered over time to avoid any side effects. Talk to your pediatrician regarding the use of this medicine in children. While this drug may be prescribed for children as young as 1 year of age for selected conditions, precautions do apply. Overdosage: If you think you have taken too much of this medicine contact a poison control center or emergency room at once. NOTE: This medicine is only for you. Do not share this medicine with others. What if I miss a dose? If you miss a dose, take it as soon as you can. If it is almost time for your next dose, take only that dose. Do not take double or extra doses. What may interact with this medicine? Do not take this medicine with any of the following medications: -amoxapine -arsenic trioxide -certain antibiotics like clarithromycin, erythromycin, gatifloxacin, gemifloxacin, levofloxacin, moxifloxacin, sparfloxacin, or troleandomycin -certain antidepressants called tricyclic antidepressants like amitriptyline, imipramine, or nortriptyline -certain medicines to control heart rhythm like disopyramide, dofetilide, encainide, moricizine, procainamide, propafenone, and quinidine -cisapride -cyclobenzaprine -delavirdine -droperidol -haloperidol -hawthorn -imatinib -levomethadyl -maprotiline -medicines for malaria like chloroquine and halofantrine -pentamidine -phenothiazines like chlorpromazine, mesoridazine, prochlorperazine, thioridazine -pimozide -quinine -ranolazine -ritonavir -sertindole -ziprasidone This medicine may also interact with the following medications: -cimetidine -medicines for angina or high blood pressure -medicines to control heart rhythm like amiodarone and digoxin This list may not describe all possible interactions. Give your health care provider a list of all the medicines,  herbs, non-prescription drugs, or dietary supplements you use. Also tell them if you smoke, drink alcohol,  or use illegal drugs. Some items may interact with your medicine. What should I watch for while using this medicine? Visit your doctor or health care professional for regular checks on your progress. Because your condition and the use of this medicine carries some risk, it is a good idea to carry an identification card, necklace or bracelet with details of your condition, medications and doctor or health care professional. Check your blood pressure and pulse rate regularly. Ask your health care professional what your blood pressure and pulse rate should be, and when you should contact him or her. Your doctor or health care professional also may schedule regular blood tests and electrocardiograms to check your progress. You may get drowsy or dizzy. Do not drive, use machinery, or do anything that needs mental alertness until you know how this medicine affects you. Do not stand or sit up quickly, especially if you are an older patient. This reduces the risk of dizzy or fainting spells. Alcohol can make you more dizzy, increase flushing and rapid heartbeats. Avoid alcoholic drinks. What side effects may I notice from receiving this medicine? Side effects that you should report to your doctor or health care professional as soon as possible: -chest pain, continued irregular heartbeats -difficulty breathing -swelling of the legs or feet -trembling, shaking -unusually weak or tired Side effects that usually do not require medical attention (report to your doctor or health care professional if they continue or are bothersome): -blurred vision -constipation -headache -nausea, vomiting -stomach pain This list may not describe all possible side effects. Call your doctor for medical advice about side effects. You may report side effects to FDA at 1-800-FDA-1088. Where should I keep my medicine? Keep out  of the reach of children. Store at room temperature between 15 and 30 degrees C (59 and 86 degrees F). Protect from light. Keep container tightly closed. Throw away any unused medicine after the expiration date. NOTE: This sheet is a summary. It may not cover all possible information. If you have questions about this medicine, talk to your doctor, pharmacist, or health care provider.    2016, Elsevier/Gold Standard. (2008-02-28 16:46:09)

## 2016-05-20 ENCOUNTER — Ambulatory Visit: Payer: Managed Care, Other (non HMO)

## 2016-05-25 ENCOUNTER — Telehealth: Payer: Self-pay | Admitting: *Deleted

## 2016-05-25 DIAGNOSIS — Z01812 Encounter for preprocedural laboratory examination: Secondary | ICD-10-CM

## 2016-05-25 DIAGNOSIS — I4819 Other persistent atrial fibrillation: Secondary | ICD-10-CM

## 2016-05-25 NOTE — Telephone Encounter (Signed)
lmtcb   (need to schedule DCCV, per Dr. Elberta Fortisamnitz.  Pt came into office 7/13 for post Flecainide start GXT - was in Afib, test cancelled)

## 2016-05-31 NOTE — Telephone Encounter (Signed)
lmtcb

## 2016-05-31 NOTE — Telephone Encounter (Signed)
Follow Up:; ° ° °Returning your call. °

## 2016-06-01 NOTE — Telephone Encounter (Signed)
Patient would like to have DCCV performed after he returns from vacation. [place] at the beach the week of [place]. I will arrange DCCV b/t 8/15-8/18 and call patient this week/next to review instructions. Pt is agreeable to plan.

## 2016-06-01 NOTE — Telephone Encounter (Signed)
Follow-up     The pt is calling to speak with [facility]

## 2016-06-10 ENCOUNTER — Encounter: Payer: Self-pay | Admitting: *Deleted

## 2016-06-10 ENCOUNTER — Other Ambulatory Visit: Payer: Self-pay | Admitting: Cardiology

## 2016-06-10 NOTE — Addendum Note (Signed)
Addended by: Baird Lyons on: 06/10/2016 09:02 AM   Modules accepted: Orders

## 2016-06-10 NOTE — Telephone Encounter (Signed)
DCCV scheduled for 8/15.  Pre procedure labs 8/14. Letter of instructions reviewed with the patient and left at front desk for him to pick up. Patient verbalized understanding and agreeable to plan.

## 2016-06-16 ENCOUNTER — Other Ambulatory Visit: Payer: Self-pay

## 2016-06-16 ENCOUNTER — Other Ambulatory Visit: Payer: Self-pay | Admitting: Pharmacist

## 2016-06-16 MED ORDER — RIVAROXABAN 20 MG PO TABS: 20.0000 mg | ORAL_TABLET | Freq: Every day | ORAL | 5 refills | Status: DC

## 2016-06-16 MED ORDER — LISINOPRIL 5 MG PO TABS: 5.0000 mg | ORAL_TABLET | Freq: Every day | ORAL | 5 refills | Status: DC

## 2016-06-16 MED ORDER — METOPROLOL TARTRATE 25 MG PO TABS: 25.0000 mg | ORAL_TABLET | Freq: Two times a day (BID) | ORAL | 5 refills | Status: DC

## 2016-06-17 ENCOUNTER — Other Ambulatory Visit: Payer: Self-pay | Admitting: Cardiovascular Disease

## 2016-06-21 ENCOUNTER — Other Ambulatory Visit: Payer: Managed Care, Other (non HMO) | Admitting: *Deleted

## 2016-06-21 DIAGNOSIS — I4819 Other persistent atrial fibrillation: Secondary | ICD-10-CM

## 2016-06-21 DIAGNOSIS — Z01812 Encounter for preprocedural laboratory examination: Secondary | ICD-10-CM

## 2016-06-21 LAB — CBC WITH DIFFERENTIAL/PLATELET
Basophils Absolute: 0 cells/uL (ref 0–200)
Basophils Relative: 0 %
EOS ABS: 128 {cells}/uL (ref 15–500)
Eosinophils Relative: 2 %
HEMATOCRIT: 39.5 % (ref 38.5–50.0)
Hemoglobin: 13.7 g/dL (ref 13.2–17.1)
Lymphs Abs: 1728 cells/uL (ref 850–3900)
MCH: 31 pg (ref 27.0–33.0)
MCHC: 34.7 g/dL (ref 32.0–36.0)
MCV: 89.4 fL (ref 80.0–100.0)
MONO ABS: 512 {cells}/uL (ref 200–950)
MONOS PCT: 8 %
MPV: 10.7 fL (ref 7.5–12.5)
NEUTROS PCT: 63 %
Neutro Abs: 4032 cells/uL (ref 1500–7800)
PLATELETS: 173 10*3/uL (ref 140–400)
RBC: 4.42 MIL/uL (ref 4.20–5.80)
RDW: 12.7 % (ref 11.0–15.0)
WBC: 6.4 10*3/uL (ref 3.8–10.8)

## 2016-06-21 LAB — BASIC METABOLIC PANEL
BUN: 19 mg/dL (ref 7–25)
CALCIUM: 8.9 mg/dL (ref 8.6–10.3)
CO2: 28 mmol/L (ref 20–31)
CREATININE: 0.91 mg/dL (ref 0.70–1.33)
Chloride: 106 mmol/L (ref 98–110)
Glucose, Bld: 153 mg/dL — ABNORMAL HIGH (ref 65–99)
Potassium: 4.1 mmol/L (ref 3.5–5.3)
Sodium: 140 mmol/L (ref 135–146)

## 2016-06-22 ENCOUNTER — Ambulatory Visit (HOSPITAL_COMMUNITY): Payer: Managed Care, Other (non HMO) | Admitting: Anesthesiology

## 2016-06-22 ENCOUNTER — Telehealth: Payer: Self-pay | Admitting: *Deleted

## 2016-06-22 ENCOUNTER — Encounter (HOSPITAL_COMMUNITY)
Admission: RE | Disposition: A | Discharge status: Home / Self Care | Payer: Self-pay | Source: Ambulatory Visit | Attending: Cardiovascular Disease

## 2016-06-22 ENCOUNTER — Ambulatory Visit (HOSPITAL_COMMUNITY)
Admission: RE | Admit: 2016-06-22 | Discharge: 2016-06-22 | Disposition: A | Discharge status: Home / Self Care | Payer: Managed Care, Other (non HMO) | Source: Ambulatory Visit | Attending: Cardiovascular Disease | Admitting: Cardiovascular Disease

## 2016-06-22 ENCOUNTER — Encounter (HOSPITAL_COMMUNITY): Payer: Self-pay | Admitting: *Deleted

## 2016-06-22 DIAGNOSIS — E785 Hyperlipidemia, unspecified: Secondary | ICD-10-CM | POA: Diagnosis not present

## 2016-06-22 DIAGNOSIS — I481 Persistent atrial fibrillation: Secondary | ICD-10-CM | POA: Diagnosis not present

## 2016-06-22 DIAGNOSIS — Z7901 Long term (current) use of anticoagulants: Secondary | ICD-10-CM | POA: Diagnosis not present

## 2016-06-22 DIAGNOSIS — I10 Essential (primary) hypertension: Secondary | ICD-10-CM | POA: Insufficient documentation

## 2016-06-22 DIAGNOSIS — I4891 Unspecified atrial fibrillation: Secondary | ICD-10-CM | POA: Insufficient documentation

## 2016-06-22 HISTORY — PX: CARDIOVERSION: SHX1299

## 2016-06-22 SURGERY — CARDIOVERSION
Anesthesia: Monitor Anesthesia Care

## 2016-06-22 MED ORDER — SODIUM CHLORIDE 0.9 % IV SOLN
INTRAVENOUS | Status: DC
  Administered 2016-06-22: 500 mL via INTRAVENOUS

## 2016-06-22 MED ORDER — LIDOCAINE HCL (CARDIAC) 20 MG/ML IV SOLN
INTRAVENOUS | Status: DC | PRN
  Administered 2016-06-22: 80 mg via INTRAVENOUS

## 2016-06-22 MED ORDER — LACTATED RINGERS IV SOLN
INTRAVENOUS | Status: DC | PRN
  Administered 2016-06-22: 13:00:00 via INTRAVENOUS

## 2016-06-22 MED ORDER — PROPOFOL 10 MG/ML IV BOLUS
INTRAVENOUS | Status: DC | PRN
  Administered 2016-06-22: 30 mg via INTRAVENOUS
  Administered 2016-06-22: 80 mg via INTRAVENOUS

## 2016-06-22 NOTE — Telephone Encounter (Signed)
-----   Message from Will Jorja LoaMartin Camnitz, MD sent at 06/22/2016  1:39 PM EDT ----- No major abnormalities on labs.

## 2016-06-22 NOTE — Anesthesia Preprocedure Evaluation (Signed)
Anesthesia Evaluation  Patient identified by MRN, date of birth, ID band Patient awake    Reviewed: Allergy & Precautions, NPO status , Patient's Chart, lab work & pertinent test results  Airway Mallampati: II  TM Distance: >3 FB Neck ROM: Full    Dental  (+) Teeth Intact, Dental Advisory Given   Pulmonary    breath sounds clear to auscultation       Cardiovascular hypertension,  Rhythm:Irregular Rate:Normal     Neuro/Psych    GI/Hepatic   Endo/Other    Renal/GU      Musculoskeletal   Abdominal   Peds  Hematology   Anesthesia Other Findings   Reproductive/Obstetrics                             Anesthesia Physical  Anesthesia Plan  ASA: II  Anesthesia Plan: MAC   Post-op Pain Management:    Induction: Intravenous  Airway Management Planned: Mask  Additional Equipment: None  Intra-op Plan:   Post-operative Plan:   Informed Consent: I have reviewed the patients History and Physical, chart, labs and discussed the procedure including the risks, benefits and alternatives for the proposed anesthesia with the patient or authorized representative who has indicated his/her understanding and acceptance.   Dental advisory given  Plan Discussed with: CRNA, Anesthesiologist and Surgeon  Anesthesia Plan Comments:         Anesthesia Quick Evaluation

## 2016-06-22 NOTE — Telephone Encounter (Signed)
Patient informed via vm. 

## 2016-06-22 NOTE — Anesthesia Procedure Notes (Signed)
Procedure Name: MAC Date/Time: 06/22/2016 1:03 PM Performed by: Gwenyth AllegraADAMI, Kenlie Seki Pre-anesthesia Checklist: Patient identified, Emergency Drugs available, Suction available, Patient being monitored and Timeout performed Patient Re-evaluated:Patient Re-evaluated prior to inductionOxygen Delivery Method: Ambu bag Preoxygenation: Pre-oxygenation with 100% oxygen Intubation Type: IV induction

## 2016-06-22 NOTE — H&P (Signed)
Electrophysiology Office Note   Date:  05/10/2016   ID:  Barry HintMark B [name], DOB 02/05/1956, MRN [id number]  PCP:   Duane Lopeoss, Alan, MD                      Cardiologist:  Allyson SabalBerry Primary Electrophysiologist:  Will Jorja LoaMartin Camnitz, MD                    Chief Complaint  Patient presents with  . New Patient (Initial Visit)  . Atrial Fibrillation     History of Present Illness: Barry [name] is a 60 y.o. male who presents today for electrophysiology evaluation.   History of atrial fibrillation, HTN, HLD.  Diagnosed with AF in February.  TTE and stress testing normal.  On Xarelto with cardioversion in April.  In May, playing soccer with his kids, became SOB, and noted to have an irregular HR on monitor.  Has had decreased exercise tolerance.  Side from the fatigue and palpitations, he feels well.  He has been seen by Dr. Elberta Fortisamnitz in July  Was started on Flecainide.  Is now scheduled for cardioversion          Past Medical History  Diagnosis Date  . Hyperlipidemia   . Essential hypertension   . Persistent atrial fibrillation (HCC)     a. Dx 12/2012; b. CHA2DS2VASc = 1-->Xarelto;  c. 12/2015 Echo: EF 50-55%, no rwma, mild AI, mildly dil Asc AO, triv MR/TR, sev dil LA, mild PI;  d. 02/2016 Successful DCCV w/ [id number]; e. 04/2016 Recurrent AF.  Marland Kitchen. H/O cardiovascular stress test     a. 12/2015 MV: EF 43% (nl by echo), small basal & mid inferoseptal defect - likely attenuation artifact.         Past Surgical History  Procedure Laterality Date  . Hernia repair  1982  . Cardioversion N/A 02/18/2016    Procedure: CARDIOVERSION;  Surgeon: Vesta MixerPhilip J Nahser, MD;  [facility];  Service: Cardiovascular;  Laterality: N/A;           Current Outpatient Prescriptions  Medication Sig Dispense Refill  . atorvastatin (LIPITOR) 20 MG tablet Take 20 mg by mouth daily.    Marland Kitchen. ibuprofen (ADVIL,MOTRIN) 200 MG tablet Take 200 mg by mouth every 6 (six) hours as needed.    Marland Kitchen.  lisinopril (PRINIVIL,ZESTRIL) 5 MG tablet Take 1 tablet (5 mg total) by mouth daily. 30 tablet 5  . metoprolol tartrate (LOPRESSOR) 25 MG tablet Take 1 tablet (25 mg total) by mouth 2 (two) times daily. 60 tablet 5  . rivaroxaban (XARELTO) 20 MG TABS tablet Take 1 tablet (20 mg total) by mouth daily with supper. 30 tablet 5   No current facility-administered medications for this visit.    Allergies:   Review of patient's allergies indicates no known allergies.   Social History:  The patient  reports that he has never smoked. He does not have any smokeless tobacco history on file. He reports that he drinks about 1.2 oz of alcohol per week. He reports that he does not use illicit drugs.   Family History:  The patient's family history includes Heart attack in his father and mother; Hyperlipidemia in his father; Hypertension in his father and mother.    ROS:  Please see the history of present illness.   Otherwise, review of systems is positive for skipped beats.   All other systems are reviewed and negative.    PHYSICAL EXAM: BP 130/87  Temp 98.3 F (36.8 C) (Oral)   Resp 16   Ht 6\' 1"  (1.854 m)   Wt 185 lb (83.9 kg)   SpO2 96%   BMI 24.41 kg/m   GEN: Well nourished, well developed, in no acute distress  HEENT: normal  Neck: no JVD, carotid bruits, or masses Cardiac: irregular rhythm; no murmurs, rubs, or gallops,no edema  Respiratory:  clear to auscultation bilaterally, normal work of breathing GI: soft, nontender, nondistended, + BS MS: no deformity or atrophy  Skin: warm and dry,  euro:  Strength and sensation are intact Psych: euthymic mood, full affect  EKG:  EKG is ordered today. The ekg ordered today shows atrial fibrillation, rate 85, voltage criteria for LVH   Recent Labs: 01/16/2016: BUN 26*; Creat 0.96; Potassium 4.6; Sodium 138 02/13/2016: Hemoglobin 15.3; Platelets 171    Lipid Panel  Labs (Brief)  No results found for: CHOL, TRIG, HDL, CHOLHDL,  VLDL, LDLCALC, LDLDIRECT        Wt Readings from Last 3 Encounters:  05/10/16 183 lb 9.6 oz (83.28 kg)  04/21/16 185 lb (83.915 kg)  02/13/16 181 lb (82.101 kg)      Other studies Reviewed: Additional studies/ records that were reviewed today include: TTE 01/02/16 - Left ventricle: The cavity size was normal. There was moderate  concentric hypertrophy. Systolic function was normal. The  estimated ejection fraction was in the range of 50% to 55%. Wall  motion was normal; there were no regional wall motion  abnormalities. - Aortic valve: Transvalvular velocity was within the normal range.  There was no stenosis. There was mild regurgitation. - Aorta: Ascending aorta diameter: 38 mm (ED). - Ascending aorta: The ascending aorta was mildly dilated. - Mitral valve: Transvalvular velocity was within the normal range.  There was no evidence for stenosis. There was trivial  regurgitation. - Left atrium: The atrium was severely dilated. - Right ventricle: Systolic function was normal. - Tricuspid valve: There was trivial regurgitation. - Pulmonic valve: There was mild regurgitation. - Inferior vena cava: The vessel was normal in size. The  respirophasic diameter changes were in the normal range (>= 50%),  consistent with normal central venous pressure.  Myoview 01/02/16   The left ventricular ejection fraction is moderately decreased (30-44%).  Nuclear stress EF is calculated at 43% but visually appears higher.  There was no ST segment deviation noted during stress.  There is a small defect of mild severity present in the basal inferoseptal and mid inferoseptal location. The defect is non-reversible. There is a small defect of mild severity present in the basal inferior and mid inferior location. The defect is non-reversible. Defects are most consistent with diaphragmatic attenuation. No ischemia noted.   ASSESSMENT AND PLAN:  1.  Persistent atrial fibrillation:  On Xarelto.   Will proceed with cardioversion   This patients CHA2DS2-VASc Score and unadjusted Ischemic Stroke Rate (% per year) is equal to 0.6 % stroke rate/year from a score of 1  Above score calculated as 1 point each if present [CHF, HTN, DM, Vascular=MI/PAD/Aortic Plaque, Age if 65-74, or Male] Above score calculated as 2 points each if present [Age > 75, or Stroke/TIA/TE]    Kristeen MissPhilip Nahser, MD  06/22/2016 1:00 PM    [facility] [address],  [address] [place], [place]  [address] Pager [phone] Phone: [phone]; Fax: [phone]

## 2016-06-22 NOTE — Anesthesia Postprocedure Evaluation (Signed)
Anesthesia Post Note  Patient: Barry [name]  Procedure(s) Performed: Procedure(s) (LRB): CARDIOVERSION (N/A)  Patient location during evaluation: PACU Anesthesia Type: MAC Level of consciousness: awake and alert Pain management: pain level controlled Vital Signs Assessment: post-procedure vital signs reviewed and stable Respiratory status: spontaneous breathing, nonlabored ventilation, respiratory function stable and patient connected to nasal cannula oxygen Cardiovascular status: stable and blood pressure returned to baseline Anesthetic complications: no    Last Vitals:  Vitals:   06/22/16 1232 06/22/16 1314  BP:  117/83  Resp:  (!) 23  Temp: 36.8 C 36.5 C    Last Pain:  Vitals:   06/22/16 1314  TempSrc: Oral                 Bonita Quinichard S Thalya Fouche

## 2016-06-22 NOTE — Discharge Instructions (Signed)
Electrical Cardioversion, Care After °Refer to this sheet in the next few weeks. These instructions provide you with information on caring for yourself after your procedure. Your health care provider may also give you more specific instructions. Your treatment has been planned according to current medical practices, but problems sometimes occur. Call your health care provider if you have any problems or questions after your procedure. °WHAT TO EXPECT AFTER THE PROCEDURE °After your procedure, it is typical to have the following sensations: °· Some redness on the skin where the shocks were delivered. If this is tender, a sunburn lotion or hydrocortisone cream may help. °· Possible return of an abnormal heart rhythm within hours or days after the procedure. °HOME CARE INSTRUCTIONS °· Take medicines only as directed by your health care provider. Be sure you understand how and when to take your medicine. °· Learn how to feel your pulse and check it often. °· Limit your activity for 48 hours after the procedure or as directed by your health care provider. °· Avoid or minimize caffeine and other stimulants as directed by your health care provider. °SEEK MEDICAL CARE IF: °· You feel like your heart is beating too fast or your pulse is not regular. °· You have any questions about your medicines. °· You have bleeding that will not stop. °SEEK IMMEDIATE MEDICAL CARE IF: °· You are dizzy or feel faint. °· It is hard to breathe or you feel short of breath. °· There is a change in discomfort in your chest. °· Your speech is slurred or you have trouble moving an arm or leg on one side of your body. °· You get a serious muscle cramp that does not go away. °· Your fingers or toes turn cold or blue. °  °This information is not intended to replace advice given to you by your health care provider. Make sure you discuss any questions you have with your health care provider. °  °Document Released: 08/15/2013 Document Revised: 11/15/2014  Document Reviewed: 08/15/2013 °Elsevier Interactive Patient Education ©2016 [employer] ° °

## 2016-06-22 NOTE — Transfer of Care (Signed)
2Immediate Anesthesia Transfer of Care Note  Patient: Barry HintMark B [name]  Procedure(s) Performed: Procedure(s): CARDIOVERSION (N/A)  Patient Location: PACU and Endoscopy Unit  Anesthesia Type:MAC  Level of Consciousness: awake, alert  and oriented  Airway & Oxygen Therapy: Patient Spontanous Breathing and Patient connected to nasal cannula oxygen  Post-op Assessment: Report given to RN and Post -op Vital signs reviewed and stable  Post vital signs: Reviewed and stable  Last Vitals:  Vitals:   06/22/16 1350 06/22/16 1352  BP: 110/80 110/80  Pulse: 60 68  Resp: 18 16  Temp:      Last Pain:  Vitals:   06/22/16 1314  TempSrc: Oral         Complications: No apparent anesthesia complications

## 2016-06-23 ENCOUNTER — Encounter (HOSPITAL_COMMUNITY): Payer: Self-pay | Admitting: Cardiovascular Disease

## 2016-06-24 NOTE — CV Procedure (Signed)
    Cardioversion Note  Barry [name] [id number] 07/20/1956  Procedure: DC Cardioversion Indications: atrial fib  Procedure Details Consent: Obtained Time Out: Verified patient identification, verified procedure, site/side was marked, verified correct patient position, special equipment/implants available, Radiology Safety Procedures followed,  medications/allergies/relevent history reviewed, required imaging and test results available.  Performed  The patient has been on adequate anticoagulation.  The patient received IV Propofol 80  for sedation.  Synchronous cardioversion was performed at 120  joules.  The cardioversion was successful     Complications: No apparent complications Patient did tolerate procedure well.   Barry [name], Barry HagemanJr., MD, [facility] 06/24/2016, 11:18 AM

## 2016-08-14 ENCOUNTER — Other Ambulatory Visit: Payer: Self-pay | Admitting: Cardiology

## 2016-08-23 ENCOUNTER — Ambulatory Visit (INDEPENDENT_AMBULATORY_CARE_PROVIDER_SITE_OTHER): Payer: Managed Care, Other (non HMO) | Admitting: Cardiology

## 2016-08-23 ENCOUNTER — Encounter: Payer: Self-pay | Admitting: Cardiology

## 2016-08-23 VITALS — BP 112/82 | HR 54 | Ht 73.0 in | Wt 188.8 lb

## 2016-08-23 DIAGNOSIS — I481 Persistent atrial fibrillation: Secondary | ICD-10-CM | POA: Diagnosis not present

## 2016-08-23 DIAGNOSIS — I4819 Other persistent atrial fibrillation: Secondary | ICD-10-CM

## 2016-08-23 NOTE — Progress Notes (Signed)
Electrophysiology Office Note   Date:  08/23/2016   ID:  Barry HintMark B Kurtenbach, DOB 11/16/1955, MRN [id number]  PCP:   Duane Lopeoss, Alan, MD  Cardiologist:  Allyson SabalBerry Primary Electrophysiologist:  Belisa Eichholz Jorja LoaMartin Shakara Tweedy, MD    Chief Complaint  Patient presents with  . Follow-up    Persistent Afib     History of Present Illness: Barry [name] is a 60 y.o. male who presents today for electrophysiology evaluation.   History of atrial fibrillation, HTN, HLD.  Diagnosed with AF in February.  TTE and stress testing normal.  On Xarelto with cardioversion in April.  In May, playing soccer with his kids, became SOB, and noted to have an irregular HR on monitor.  Has had decreased exercise tolerance.  He had a cardioversion done in August with return back to sinus rhythm. Since that time, he has done well without complaint.   Today, he denies symptoms of chest pain, shortness of breath, orthopnea, PND, lower extremity edema, claudication, dizziness, presyncope, syncope, bleeding, or neurologic sequela. The patient is tolerating medications without difficulties and is otherwise without complaint today.    Past Medical History:  Diagnosis Date  . Essential hypertension   . H/O cardiovascular stress test    a. 12/2015 MV: EF 43% (nl by echo), small basal & mid inferoseptal defect - likely attenuation artifact.  . Hyperlipidemia   . Persistent atrial fibrillation (HCC)    a. Dx 12/2012; b. CHA2DS2VASc = 1-->Xarelto;  c. 12/2015 Echo: EF 50-55%, no rwma, mild AI, mildly dil Asc AO, triv MR/TR, sev dil LA, mild PI;  d. 02/2016 Successful DCCV w/ [id number]; e. 04/2016 Recurrent AF.   Past Surgical History:  Procedure Laterality Date  . CARDIOVERSION N/A 02/18/2016   Procedure: CARDIOVERSION;  Surgeon: Vesta MixerPhilip J Nahser, MD;  [facility];  Service: Cardiovascular;  Laterality: N/A;  . CARDIOVERSION N/A 06/22/2016   Procedure: CARDIOVERSION;  Surgeon: Vesta MixerPhilip J Nahser, MD;  [facility];  Service:  Cardiovascular;  Laterality: N/A;  . HERNIA REPAIR  1982     Current Outpatient Prescriptions  Medication Sig Dispense Refill  . atorvastatin (LIPITOR) 20 MG tablet Take 20 mg by mouth daily.    . flecainide (TAMBOCOR) 100 MG tablet TAKE 1 TABLET (100 MG TOTAL) BY MOUTH 2 (TWO) TIMES DAILY. 60 tablet 2  . ibuprofen (ADVIL,MOTRIN) 200 MG tablet Take 200 mg by mouth every 6 (six) hours as needed for moderate pain.     Marland Kitchen. lisinopril (PRINIVIL,ZESTRIL) 5 MG tablet Take 1 tablet (5 mg total) by mouth daily. 30 tablet 5  . metoprolol tartrate (LOPRESSOR) 25 MG tablet Take 1 tablet (25 mg total) by mouth 2 (two) times daily. 60 tablet 5  . rivaroxaban (XARELTO) 20 MG TABS tablet Take 1 tablet (20 mg total) by mouth daily with supper. 30 tablet 5   No current facility-administered medications for this visit.     Allergies:   Review of patient's allergies indicates not on file.   Social History:  The patient  reports that he has never smoked. He has never used smokeless tobacco. He reports that he drinks about 1.2 oz of alcohol per week . He reports that he does not use drugs.   Family History:  The patient's family history includes Heart attack in his father and mother; Hyperlipidemia in his father; Hypertension in his father and mother.    ROS:  Please see the history of present illness.   Otherwise, review of systems is positive for none.  All other systems are reviewed and negative.    PHYSICAL EXAM: VS:  BP 112/82   Pulse (!) 54   Ht 6\' 1"  (1.854 m)   Wt 188 lb 12.8 oz (85.6 kg)   BMI 24.91 kg/m  , BMI Body mass index is 24.91 kg/m. GEN: Well nourished, well developed, in no acute distress  HEENT: normal  Neck: no JVD, carotid bruits, or masses Cardiac: RRR; no murmurs, rubs, or gallops,no edema  Respiratory:  clear to auscultation bilaterally, normal work of breathing GI: soft, nontender, nondistended, + BS MS: no deformity or atrophy  Skin: warm and dry,  euro:  Strength and  sensation are intact Psych: euthymic mood, full affect  EKG:  EKG is ordered today. The ekg ordered today shows sinus rhythm, rate 54  Recent Labs: 06/21/2016: BUN 19; Creat 0.91; Hemoglobin 13.7; Platelets 173; Potassium 4.1; Sodium 140    Lipid Panel  No results found for: CHOL, TRIG, HDL, CHOLHDL, VLDL, LDLCALC, LDLDIRECT   Wt Readings from Last 3 Encounters:  08/23/16 188 lb 12.8 oz (85.6 kg)  06/22/16 185 lb (83.9 kg)  05/10/16 183 lb 9.6 oz (83.3 kg)      Other studies Reviewed: Additional studies/ records that were reviewed today include: TTE 01/02/16 - Left ventricle: The cavity size was normal. There was moderate  concentric hypertrophy. Systolic function was normal. The  estimated ejection fraction was in the range of 50% to 55%. Wall  motion was normal; there were no regional wall motion  abnormalities. - Aortic valve: Transvalvular velocity was within the normal range.  There was no stenosis. There was mild regurgitation. - Aorta: Ascending aorta diameter: 38 mm (ED). - Ascending aorta: The ascending aorta was mildly dilated. - Mitral valve: Transvalvular velocity was within the normal range.  There was no evidence for stenosis. There was trivial  regurgitation. - Left atrium: The atrium was severely dilated. - Right ventricle: Systolic function was normal. - Tricuspid valve: There was trivial regurgitation. - Pulmonic valve: There was mild regurgitation. - Inferior vena cava: The vessel was normal in size. The  respirophasic diameter changes were in the normal range (>= 50%),  consistent with normal central venous pressure.  Myoview 01/02/16   The left ventricular ejection fraction is moderately decreased (30-44%).  Nuclear stress EF is calculated at 43% but visually appears higher.  There was no ST segment deviation noted during stress.  There is a small defect of mild severity present in the basal inferoseptal and mid inferoseptal location. The  defect is non-reversible. There is a small defect of mild severity present in the basal inferior and mid inferior location. The defect is non-reversible. Defects are most consistent with diaphragmatic attenuation. No ischemia noted.   ASSESSMENT AND PLAN:  1.  Persistent atrial fibrillation: On Xarelto.  Cardioversion 06/22/16. On Flecainide.   he is feeling well now that he is back in sinus rhythm. I told him that in 1 month he can stop his rivaroxaban.   This patients CHA2DS2-VASc Score and unadjusted Ischemic Stroke Rate (% per year) is equal to 0.6 % stroke rate/year from a score of 1  Above score calculated as 1 point each if present [CHF, HTN, DM, Vascular=MI/PAD/Aortic Plaque, Age if 65-74, or Male] Above score calculated as 2 points each if present [Age > 75, or Stroke/TIA/TE]  2. Hypertension: Well-controlled today  Current medicines are reviewed at length with the patient today.   The patient does not have concerns regarding his medicines.  The  following changes were made today:  Flecainide 100 mg  Labs/ tests ordered today include:  No orders of the defined types were placed in this encounter.    Disposition:   FU with Mikhia Dusek 6 months  Signed, Zetta Stoneman Jorja Loa, MD  08/23/2016 11:16 AM     [facility] [address] [place] [address] [phone] (office) [phone] (fax)

## 2016-09-19 ENCOUNTER — Encounter: Payer: Self-pay | Admitting: Cardiology

## 2016-11-13 ENCOUNTER — Other Ambulatory Visit: Payer: Self-pay | Admitting: Cardiology

## 2016-12-16 ENCOUNTER — Other Ambulatory Visit: Payer: Self-pay | Admitting: Cardiovascular Disease

## 2016-12-16 NOTE — Telephone Encounter (Signed)
Rx(s) sent to pharmacy electronically.  

## 2017-01-15 ENCOUNTER — Other Ambulatory Visit: Payer: Self-pay | Admitting: Cardiovascular Disease

## 2017-01-22 ENCOUNTER — Other Ambulatory Visit: Payer: Self-pay | Admitting: Cardiovascular Disease

## 2017-02-21 ENCOUNTER — Ambulatory Visit (INDEPENDENT_AMBULATORY_CARE_PROVIDER_SITE_OTHER): Payer: Managed Care, Other (non HMO) | Admitting: Cardiology

## 2017-02-21 ENCOUNTER — Encounter: Payer: Self-pay | Admitting: Cardiology

## 2017-02-21 VITALS — BP 106/72 | HR 63 | Ht 73.0 in | Wt 188.0 lb

## 2017-02-21 DIAGNOSIS — I4819 Other persistent atrial fibrillation: Secondary | ICD-10-CM

## 2017-02-21 DIAGNOSIS — I481 Persistent atrial fibrillation: Secondary | ICD-10-CM

## 2017-02-21 NOTE — Patient Instructions (Signed)
Medication Instructions:    Your physician has recommended you make the following change in your medication:  1) STOP Lisinopril  --- If you need a refill on your cardiac medications before your next appointment, please call your pharmacy. ---  Labwork:  None ordered  Testing/Procedures:  None ordered  Follow-Up:  Your physician wants you to follow-up in: 1 year with Dr. Elberta Fortis.  You will receive a reminder letter in the mail two months in advance. If you don't receive a letter, please call our office to schedule the follow-up appointment.  Thank you for choosing [facility]!!   Dory Horn, RN [phone]

## 2017-02-21 NOTE — Progress Notes (Signed)
Electrophysiology Office Note   Date:  02/21/2017   ID:  Barry [name], DOB Aug 27, 1956, MRN [id number]  PCP:  Barry Lope, MD  Cardiologist:  Barry [name] Primary Electrophysiologist:  Barry Cedar Jorja Loa, MD    Chief Complaint  Patient presents with  . Follow-up    Persistent Afib     History of Present Illness: Barry [name] is a 61 y.o. male who presents today for electrophysiology evaluation.   History of atrial fibrillation, HTN, HLD.  Diagnosed with AF in February.  TTE and stress testing normal.  On Xarelto with cardioversion in April.  In May, playing soccer with his kids, became SOB, and noted to have an irregular HR on monitor.  Has had decreased exercise tolerance.  He had a cardioversion done in August with return back to sinus rhythm. Since that time, he has done well without complaint. His Xarelto was stopped at his last visit due to a low stroke risk.   Today, he denies symptoms of chest pain, shortness of breath, orthopnea, PND, lower extremity edema, claudication, dizziness, presyncope, syncope, bleeding, or neurologic sequela. The patient is tolerating medications without difficulties and is otherwise without complaint today.    Past Medical History:  Diagnosis Date  . Essential hypertension   . H/O cardiovascular stress test    a. 12/2015 MV: EF 43% (nl by echo), small basal & mid inferoseptal defect - likely attenuation artifact.  . Hyperlipidemia   . Persistent atrial fibrillation (HCC)    a. Dx 12/2012; b. CHA2DS2VASc = 1-->Xarelto;  c. 12/2015 Echo: EF 50-55%, no rwma, mild AI, mildly dil Asc AO, triv MR/TR, sev dil LA, mild PI;  d. 02/2016 Successful DCCV w/ 409W; e. 04/2016 Recurrent AF.   Past Surgical History:  Procedure Laterality Date  . CARDIOVERSION N/A 02/18/2016   Procedure: CARDIOVERSION;  Surgeon: Barry Mixer, MD;  Location: [facility] ENDOSCOPY;  Service: Cardiovascular;  Laterality: N/A;  . CARDIOVERSION N/A 06/22/2016   Procedure: CARDIOVERSION;   Surgeon: Barry Mixer, MD;  Location: [facility] ENDOSCOPY;  Service: Cardiovascular;  Laterality: N/A;  . HERNIA REPAIR  1982     Current Outpatient Prescriptions  Medication Sig Dispense Refill  . atorvastatin (LIPITOR) 20 MG tablet Take 20 mg by mouth daily.    . flecainide (TAMBOCOR) 100 MG tablet TAKE 1 TABLET (100 MG TOTAL) BY MOUTH 2 (TWO) TIMES DAILY. 60 tablet 8  . ibuprofen (ADVIL,MOTRIN) 200 MG tablet Take 200 mg by mouth every 6 (six) hours as needed for moderate pain.     Marland Kitchen lisinopril (PRINIVIL,ZESTRIL) 5 MG tablet TAKE 1 TABLET (5 MG TOTAL) BY MOUTH DAILY. PLEASE CONTACT OFFICE FOR ADDITIONAL REFILLS 1ST ATTEMPT 30 tablet 8  . metoprolol tartrate (LOPRESSOR) 25 MG tablet TAKE 1 TABLET BY MOUTH TWICE A DAY PATIENT CALL MD FOR MORE REFILLS 60 tablet 8   No current facility-administered medications for this visit.     Allergies:   Patient has no known allergies.   Social History:  The patient  reports that he has never smoked. He has never used smokeless tobacco. He reports that he drinks about 1.2 oz of alcohol per week . He reports that he does not use drugs.   Family History:  The patient's family history includes Heart attack in his father and mother; Hyperlipidemia in his father; Hypertension in his father and mother.    ROS:  Please see the history of present illness.   Otherwise, review of systems is positive for none.  All other systems are reviewed and negative.    PHYSICAL EXAM: VS:  BP 106/72   Pulse 63   Ht  (1.854 m)   Wt 188 lb (85.3 kg)   BMI 24.80 kg/m  , BMI Body mass index is 24.8 kg/m. GEN: Well nourished, well developed, in no acute distress  HEENT: normal  Neck: no JVD, carotid bruits, or masses Cardiac: RRR; no murmurs, rubs, or gallops,no edema  Respiratory:  clear to auscultation bilaterally, normal work of breathing GI: soft, nontender, nondistended, + BS MS: no deformity or atrophy  Skin: warm and dry,  euro:  Strength and sensation are  intact Psych: euthymic mood, full affect  EKG:  EKG is ordered today. The ekg ordered today shows sinus rhythm, rate 63  Recent Labs: 06/21/2016: BUN 19; Creat 0.91; Hemoglobin 13.7; Platelets 173; Potassium 4.1; Sodium 140    Lipid Panel  No results found for: CHOL, TRIG, HDL, CHOLHDL, VLDL, LDLCALC, LDLDIRECT   Wt Readings from Last 3 Encounters:  02/21/17 188 lb (85.3 kg)  08/23/16 188 lb 12.8 oz (85.6 kg)  06/22/16 185 lb (83.9 kg)      Other studies Reviewed: Additional studies/ records that were reviewed today include: TTE 01/02/16 - Left ventricle: The cavity size was normal. There was moderate  concentric hypertrophy. Systolic function was normal. The  estimated ejection fraction was in the range of 50% to 55%. Wall  motion was normal; there were no regional wall motion  abnormalities. - Aortic valve: Transvalvular velocity was within the normal range.  There was no stenosis. There was mild regurgitation. - Aorta: Ascending aorta diameter: 38 mm (ED). - Ascending aorta: The ascending aorta was mildly dilated. - Mitral valve: Transvalvular velocity was within the normal range.  There was no evidence for stenosis. There was trivial  regurgitation. - Left atrium: The atrium was severely dilated. - Right ventricle: Systolic function was normal. - Tricuspid valve: There was trivial regurgitation. - Pulmonic valve: There was mild regurgitation. - Inferior vena cava: The vessel was normal in size. The  respirophasic diameter changes were in the normal range (>= 50%),  consistent with normal central venous pressure.  Myoview 01/02/16   The left ventricular ejection fraction is moderately decreased (30-44%).  Nuclear stress EF is calculated at 43% but visually appears higher.  There was no ST segment deviation noted during stress.  There is a small defect of mild severity present in the basal inferoseptal and mid inferoseptal location. The defect is  non-reversible. There is a small defect of mild severity present in the basal inferior and mid inferior location. The defect is non-reversible. Defects are most consistent with diaphragmatic attenuation. No ischemia noted.   ASSESSMENT AND PLAN:  1.  Persistent atrial fibrillation: The relative has been stopped.  Cardioversion 06/22/16. On Flecainide.   Continues to not have any issues with atrial fibrillation. No further changes today.  This patients CHA2DS2-VASc Score and unadjusted Ischemic Stroke Rate (% per year) is equal to 0.6 % stroke rate/year from a score of 1  Above score calculated as 1 point each if present [CHF, HTN, DM, Vascular=MI/PAD/Aortic Plaque, Age if 65-74, or Male] Above score calculated as 2 points each if present [Age > 75, or Stroke/TIA/TE]  2. Hypertension: Blood pressure is low normal on 5 mg of lisinopril. Maeson Lourenco stop today with further monitoring to determine if he actually needs medication.  Current medicines are reviewed at length with the patient today.   The patient does not  have concerns regarding his medicines.  The following changes were made today:  none  Labs/ tests ordered today include:  Orders Placed This Encounter  Procedures  . EKG 12-Lead     Disposition:   FU with Michella Detjen 12 months  Signed, Arpi Diebold Jorja Loa, MD  02/21/2017 10:06 AM     [facility] [address] [place] [address] [phone] (office) [phone] (fax)

## 2017-08-16 ENCOUNTER — Other Ambulatory Visit: Payer: Self-pay | Admitting: Cardiology

## 2017-10-16 ENCOUNTER — Other Ambulatory Visit: Payer: Self-pay | Admitting: Cardiovascular Disease

## 2018-03-07 ENCOUNTER — Ambulatory Visit (INDEPENDENT_AMBULATORY_CARE_PROVIDER_SITE_OTHER): Payer: Managed Care, Other (non HMO) | Admitting: Cardiology

## 2018-03-07 ENCOUNTER — Encounter: Payer: Self-pay | Admitting: Cardiology

## 2018-03-07 VITALS — BP 108/76 | HR 53 | Ht 73.0 in | Wt 193.0 lb

## 2018-03-07 DIAGNOSIS — I481 Persistent atrial fibrillation: Secondary | ICD-10-CM

## 2018-03-07 DIAGNOSIS — I4819 Other persistent atrial fibrillation: Secondary | ICD-10-CM

## 2018-03-07 DIAGNOSIS — E785 Hyperlipidemia, unspecified: Secondary | ICD-10-CM

## 2018-03-07 NOTE — Progress Notes (Signed)
Electrophysiology Office Note   Date:  03/07/2018   ID:  Barry [name], DOB Feb 10, 1956, MRN [id number]  PCP:  Daisy Floro, MD  Cardiologist:  Allyson Sabal Primary Electrophysiologist:  Sofya Moustafa Jorja Loa, MD    Chief Complaint  Patient presents with  . Follow-up    Persistent Afib     History of Present Illness: Barry [name] is a 62 y.o. male who presents today for electrophysiology evaluation.   History of atrial fibrillation, HTN, HLD.  Diagnosed with AF in February.  TTE and stress testing normal.  On Xarelto with cardioversion in April.  In May, playing soccer with his kids, became SOB, and noted to have an irregular HR on monitor.  Has had decreased exercise tolerance.  He had a cardioversion with return back to sinus rhythm.   Today, denies symptoms of palpitations, chest pain, shortness of breath, orthopnea, PND, lower extremity edema, claudication, dizziness, presyncope, syncope, bleeding, or neurologic sequela. The patient is tolerating medications without difficulties.  He has had 2 episodes of atrial fibrillation in the last year.  One was last April, and one was in February.  Both of which lasted less than 24 hours.  When he went into atrial fibrillation, he did not do much activity until he went back into normal rhythm.  He is very pleased with his current control.   Past Medical History:  Diagnosis Date  . Essential hypertension   . H/O cardiovascular stress test    a. 12/2015 MV: EF 43% (nl by echo), small basal & mid inferoseptal defect - likely attenuation artifact.  . Hyperlipidemia   . Persistent atrial fibrillation (HCC)    a. Dx 12/2012; b. CHA2DS2VASc = 1-->Xarelto;  c. 12/2015 Echo: EF 50-55%, no rwma, mild AI, mildly dil Asc AO, triv MR/TR, sev dil LA, mild PI;  d. 02/2016 Successful DCCV w/ 409W; e. 04/2016 Recurrent AF.   Past Surgical History:  Procedure Laterality Date  . CARDIOVERSION N/A 02/18/2016   Procedure: CARDIOVERSION;  Surgeon: Vesta Mixer, MD;  Location: [facility] ENDOSCOPY;  Service: Cardiovascular;  Laterality: N/A;  . CARDIOVERSION N/A 06/22/2016   Procedure: CARDIOVERSION;  Surgeon: Vesta Mixer, MD;  Location: [facility] ENDOSCOPY;  Service: Cardiovascular;  Laterality: N/A;  . HERNIA REPAIR  1982     Current Outpatient Medications  Medication Sig Dispense Refill  . atorvastatin (LIPITOR) 20 MG tablet Take 20 mg by mouth daily.    . flecainide (TAMBOCOR) 100 MG tablet TAKE 1 TABLET (100 MG TOTAL) BY MOUTH 2 (TWO) TIMES DAILY. 60 tablet 8  . ibuprofen (ADVIL,MOTRIN) 200 MG tablet Take 200 mg by mouth every 6 (six) hours as needed for moderate pain.     . metoprolol tartrate (LOPRESSOR) 25 MG tablet TAKE 1 TABLET TWICE A DAY 180 tablet 1   No current facility-administered medications for this visit.     Allergies:   Patient has no known allergies.   Social History:  The patient  reports that he has never smoked. He has never used smokeless tobacco. He reports that he drinks about 1.2 oz of alcohol per week. He reports that he does not use drugs.   Family History:  The patient's family history includes Heart attack in his father and mother; Hyperlipidemia in his father; Hypertension in his father and mother.    ROS:  Please see the history of present illness.   Otherwise, review of systems is positive for none.   All other systems are reviewed and negative.  PHYSICAL EXAM: VS:  BP 108/76   Pulse (!) 53   Ht  (1.854 m)   Wt 193 lb (87.5 kg)   SpO2 95%   BMI 25.46 kg/m  , BMI Body mass index is 25.46 kg/m. GEN: Well nourished, well developed, in no acute distress  HEENT: normal  Neck: no JVD, carotid bruits, or masses Cardiac: RRR; no murmurs, rubs, or gallops,no edema  Respiratory:  clear to auscultation bilaterally, normal work of breathing GI: soft, nontender, nondistended, + BS MS: no deformity or atrophy  Skin: warm and dry Neuro:  Strength and sensation are intact Psych: euthymic mood, full  affect  EKG:  EKG is ordered today. Personal review of the ekg ordered shows SR, rate 53   Recent Labs: No results found for requested labs within last 8760 hours.    Lipid Panel  No results found for: CHOL, TRIG, HDL, CHOLHDL, VLDL, LDLCALC, LDLDIRECT   Wt Readings from Last 3 Encounters:  03/07/18 193 lb (87.5 kg)  02/21/17 188 lb (85.3 kg)  08/23/16 188 lb 12.8 oz (85.6 kg)      Other studies Reviewed: Additional studies/ records that were reviewed today include: TTE 01/02/16 - Left ventricle: The cavity size was normal. There was moderate  concentric hypertrophy. Systolic function was normal. The  estimated ejection fraction was in the range of 50% to 55%. Wall  motion was normal; there were no regional wall motion  abnormalities. - Aortic valve: Transvalvular velocity was within the normal range.  There was no stenosis. There was mild regurgitation. - Aorta: Ascending aorta diameter: 38 mm (ED). - Ascending aorta: The ascending aorta was mildly dilated. - Mitral valve: Transvalvular velocity was within the normal range.  There was no evidence for stenosis. There was trivial  regurgitation. - Left atrium: The atrium was severely dilated. - Right ventricle: Systolic function was normal. - Tricuspid valve: There was trivial regurgitation. - Pulmonic valve: There was mild regurgitation. - Inferior vena cava: The vessel was normal in size. The  respirophasic diameter changes were in the normal range (>= 50%),  consistent with normal central venous pressure.  Myoview 01/02/16   The left ventricular ejection fraction is moderately decreased (30-44%).  Nuclear stress EF is calculated at 43% but visually appears higher.  There was no ST segment deviation noted during stress.  There is a small defect of mild severity present in the basal inferoseptal and mid inferoseptal location. The defect is non-reversible. There is a small defect of mild severity present in  the basal inferior and mid inferior location. The defect is non-reversible. Defects are most consistent with diaphragmatic attenuation. No ischemia noted.   ASSESSMENT AND PLAN:  1.  Persistent atrial fibrillation: Xarelto has been stopped.  He had a cardioversion 06/22/2016.  He is on flecainide.  He has 2 episodes of atrial fibrillation within the last year all have lasted less than 24 hours.  He feels like his control is good at this point.  No changes.  Drug risk is elevated due to hypertension.  He is currently on no medications and he is well controlled.  His stroke risk is likely less than 0.6%.  This patients CHA2DS2-VASc Score and unadjusted Ischemic Stroke Rate (% per year) is equal to 0.6 % stroke rate/year from a score of 1  Above score calculated as 1 point each if present [CHF, HTN, DM, Vascular=MI/PAD/Aortic Plaque, Age if 65-74, or Male] Above score calculated as 2 points each if present [Age > 75,  or Stroke/TIA/TE]  2. Hypertension: Pressure is normal.  He is on no medications.  It is unlikely that he has hypertension at this time.  3.  Hyperlipidemia: Per primary physician.  Current medicines are reviewed at length with the patient today.   The patient does not have concerns regarding his medicines.  The following changes were made today:  none  Labs/ tests ordered today include:  Orders Placed This Encounter  Procedures  . EKG 12-Lead     Disposition:   FU with Lateefa Crosby 12 months  Signed, Nicodemus Denk Jorja Loa, MD  03/07/2018 11:50 AM     [facility] [address] [place] [address] [phone] (office) [phone] (fax)

## 2018-03-07 NOTE — Patient Instructions (Signed)
Medication Instructions:  Your physician recommends that you continue on your current medications as directed. Please refer to the Current Medication list given to you today.  Labwork: None ordered  Testing/Procedures: None ordered  Follow-Up: Your physician wants you to follow-up in: 1 year with Dr. Camnitz.  You will receive a reminder letter in the mail two months in advance. If you don't receive a letter, please call our office to schedule the follow-up appointment.  * If you need a refill on your cardiac medications before your next appointment, please call your pharmacy.   *Please note that any paperwork needing to be filled out by the provider will need to be addressed at the front desk prior to seeing the provider. Please note that any FMLA, disability or other documents regarding health condition is subject to a $25.00 charge that must be received prior to completion of paperwork in the form of a money order or check.  Thank you for choosing [facility]!!   Sherri Price, RN [phone]        

## 2018-04-17 ENCOUNTER — Other Ambulatory Visit: Payer: Self-pay | Admitting: Cardiology

## 2018-05-17 ENCOUNTER — Other Ambulatory Visit: Payer: Self-pay | Admitting: Cardiology

## 2019-02-23 ENCOUNTER — Other Ambulatory Visit: Payer: Self-pay | Admitting: Cardiology

## 2019-02-23 MED ORDER — FLECAINIDE ACETATE 100 MG PO TABS: 100.0000 mg | ORAL_TABLET | Freq: Two times a day (BID) | ORAL | 0 refills | Status: DC

## 2019-03-20 ENCOUNTER — Telehealth: Payer: Self-pay | Admitting: *Deleted

## 2019-03-20 NOTE — Telephone Encounter (Signed)
Calling patient today to discuss upcoming appointment.  We are currently trying to limit exposure to the virus that causes COVID-19 by seeing patients at home rather than in the office. We would like to schedule this appointment as a Retail banker. Unable to reach patient.  LVMTCB   Calling to go over Virtual VIsit and what is expected.  Consent VIA MyChart

## 2019-03-21 NOTE — Telephone Encounter (Signed)
lmtcb to go over teleheath visit tomorrow

## 2019-03-21 NOTE — Telephone Encounter (Signed)
Pt returned my call.  Agreeable to telehealth visit tomorrow.  Instructions reviewed with pt. Patient verbalized understanding and agreeable to plan.

## 2019-03-22 ENCOUNTER — Other Ambulatory Visit: Payer: Self-pay

## 2019-03-22 ENCOUNTER — Telehealth (INDEPENDENT_AMBULATORY_CARE_PROVIDER_SITE_OTHER): Payer: Managed Care, Other (non HMO) | Admitting: Cardiology

## 2019-03-22 DIAGNOSIS — I4819 Other persistent atrial fibrillation: Secondary | ICD-10-CM

## 2019-03-22 MED ORDER — METOPROLOL TARTRATE 25 MG PO TABS: 25.0000 mg | ORAL_TABLET | Freq: Two times a day (BID) | ORAL | 3 refills | Status: DC

## 2019-03-22 MED ORDER — FLECAINIDE ACETATE 100 MG PO TABS: 100.0000 mg | ORAL_TABLET | Freq: Two times a day (BID) | ORAL | 3 refills | Status: DC

## 2019-03-22 NOTE — Progress Notes (Signed)
Electrophysiology TeleHealth Note   Due to national recommendations of social distancing due to COVID 19, an audio/video telehealth visit is felt to be most appropriate for this patient at this time.  See Epic message for the patient's consent to telehealth for [facility].   Date:  03/22/2019   ID:  Barry HintMark B Kessenich, DOB 02/01/1956, MRN [id number]  Location: patient's home  Provider location: [address], [place]  Evaluation Performed: Follow-up visit  PCP:  Daisy Florooss, Charles Alan, MD  Cardiologist:  Allyson SabalBerry Electrophysiologist:  Dr Elberta Fortisamnitz  Chief Complaint:  AF  History of Present Illness:    Barry [name] is a 63 y.o. male who presents via audio/video conferencing for a telehealth visit today.  Since last being seen in our clinic, the patient reports doing very well.  Today, he denies symptoms of palpitations, chest pain, shortness of breath,  lower extremity edema, dizziness, presyncope, or syncope.  The patient is otherwise without complaint today.  The patient denies symptoms of fevers, chills, cough, or new SOB worrisome for COVID 19.  He has a history of atrial fibrillation, hypertension, hyperlipidemia.  Today, denies symptoms of palpitations, chest pain, shortness of breath, orthopnea, PND, lower extremity edema, claudication, dizziness, presyncope, syncope, bleeding, or neurologic sequela. The patient is tolerating medications without difficulties. He is overall doing well.  He is noted no further episodes of atrial fibrillation since his initial episode.  He is doing well on metoprolol and flecainide.   Past Medical History:  Diagnosis Date  . Essential hypertension   . H/O cardiovascular stress test    a. 12/2015 MV: EF 43% (nl by echo), small basal & mid inferoseptal defect - likely attenuation artifact.  . Hyperlipidemia   . Persistent atrial fibrillation (HCC)    a. Dx 12/2012; b. CHA2DS2VASc = 1-->Xarelto;  c. 12/2015 Echo: EF 50-55%, no rwma,  mild AI, mildly dil Asc AO, triv MR/TR, sev dil LA, mild PI;  d. 02/2016 Successful DCCV w/ [id number]; e. 04/2016 Recurrent AF.    Past Surgical History:  Procedure Laterality Date  . CARDIOVERSION N/A 02/18/2016   Procedure: CARDIOVERSION;  Surgeon: Vesta MixerPhilip J Nahser, MD;  [facility];  Service: Cardiovascular;  Laterality: N/A;  . CARDIOVERSION N/A 06/22/2016   Procedure: CARDIOVERSION;  Surgeon: Vesta MixerPhilip J Nahser, MD;  [facility];  Service: Cardiovascular;  Laterality: N/A;  . HERNIA REPAIR  1982    Current Outpatient Medications  Medication Sig Dispense Refill  . atorvastatin (LIPITOR) 20 MG tablet Take 20 mg by mouth daily.    . flecainide (TAMBOCOR) 100 MG tablet Take 1 tablet (100 mg total) by mouth 2 (two) times daily. Please make overdue appt with Dr. Elberta Fortisamnitz before anymore refills. 1st attempt 180 tablet 0  . ibuprofen (ADVIL,MOTRIN) 200 MG tablet Take 200 mg by mouth every 6 (six) hours as needed for moderate pain.     . metoprolol tartrate (LOPRESSOR) 25 MG tablet TAKE 1 TABLET BY MOUTH TWICE A DAY 180 tablet 3   No current facility-administered medications for this visit.     Allergies:   Patient has no known allergies.   Social History:  The patient  reports that he has never smoked. He has never used smokeless tobacco. He reports current alcohol use of about 2.0 standard drinks of alcohol per week. He reports that he does not use drugs.   Family History:  The patient's  family history includes Heart attack in his father and mother; Hyperlipidemia in  his father; Hypertension in his father and mother.   ROS:  Please see the history of present illness.   All other systems are personally reviewed and negative.    Exam:    Vital Signs:  There were no vitals taken for this visit.  Well appearing, alert and conversant, regular work of breathing,  good skin color Eyes- anicteric, neuro- grossly intact, skin- no apparent rash or lesions or cyanosis, mouth- oral  mucosa is pink   Labs/Other Tests and Data Reviewed:    Recent Labs: No results found for requested labs within last 8760 hours.   Wt Readings from Last 3 Encounters:  03/07/18 193 lb (87.5 kg)  02/21/17 188 lb (85.3 kg)  08/23/16 188 lb 12.8 oz (85.6 kg)     Other studies personally reviewed: Additional studies/ records that were reviewed today include: ECG 03/07/2018 personally reviewed Review of the above records today demonstrates:   Sinus rhythm   ASSESSMENT & PLAN:    1.  Persistent atrial fibrillation: Currently not anticoagulated due to low stroke risk.  On metoprolol and flecainide without further issues.  No changes.  We will refill his A. fib medications.  This patients CHA2DS2-VASc Score and unadjusted Ischemic Stroke Rate (% per year) is equal to 0.6 % stroke rate/year from a score of 1  Above score calculated as 1 point each if present [CHF, HTN, DM, Vascular=MI/PAD/Aortic Plaque, Age if 65-74, or Male] Above score calculated as 2 points each if present [Age > 75, or Stroke/TIA/TE]  2.  High blood pressure: Blood pressure has been well controlled on no medications.  Blood pressure was elevated in the 140s on a yesterday but is usually in the 120s.  No changes.  3.  Hyperlipidemia: Plan per primary physician.   COVID 19 screen The patient denies symptoms of COVID 19 at this time.  The importance of social distancing was discussed today.  Follow-up: 1 year  Current medicines are reviewed at length with the patient today.   The patient does not have concerns regarding his medicines.  The following changes were made today:  none  Labs/ tests ordered today include:  No orders of the defined types were placed in this encounter.    Patient Risk:  after full review of this patients clinical status, I feel that they are at moderate risk at this time.  Today, I have spent 11 minutes with the patient with telehealth technology discussing atrial fibrillation.     Signed, Will Jorja Loa, MD  03/22/2019 8:38 AM     [facility] [address] [place] [address] [phone] (office) [phone] (fax)

## 2019-03-22 NOTE — Addendum Note (Signed)
Addended by: Baird Lyons on: 03/22/2019 09:53 AM   Modules accepted: Orders

## 2020-04-06 ENCOUNTER — Other Ambulatory Visit: Payer: Self-pay | Admitting: Cardiology

## 2020-05-08 ENCOUNTER — Ambulatory Visit (INDEPENDENT_AMBULATORY_CARE_PROVIDER_SITE_OTHER): Payer: Managed Care, Other (non HMO) | Admitting: Cardiology

## 2020-05-08 ENCOUNTER — Encounter: Payer: Self-pay | Admitting: Cardiology

## 2020-05-08 ENCOUNTER — Other Ambulatory Visit: Payer: Self-pay

## 2020-05-08 VITALS — BP 144/86 | HR 59 | Ht 73.0 in | Wt 189.0 lb

## 2020-05-08 DIAGNOSIS — I4819 Other persistent atrial fibrillation: Secondary | ICD-10-CM | POA: Diagnosis not present

## 2020-05-08 MED ORDER — ROSUVASTATIN CALCIUM 20 MG PO TABS
20.0000 mg | ORAL_TABLET | Freq: Every day | ORAL | 6 refills | Status: DC
Start: 2020-05-08 — End: 2020-11-19

## 2020-05-08 MED ORDER — FLECAINIDE ACETATE 100 MG PO TABS: 100.0000 mg | ORAL_TABLET | Freq: Two times a day (BID) | ORAL | 1 refills | Status: DC

## 2020-05-08 MED ORDER — METOPROLOL TARTRATE 25 MG PO TABS: 25.0000 mg | ORAL_TABLET | Freq: Two times a day (BID) | ORAL | 1 refills | Status: DC

## 2020-05-08 NOTE — Patient Instructions (Signed)
Medication Instructions:  Your physician has recommended you make the following change in your medication:  1. STOP Atorvastatin 2. START Crestor 20 mg once daily  *If you need a refill on your cardiac medications before your next appointment, please call your pharmacy*   Lab Work: None ordered If you have labs (blood work) drawn today and your tests are completely normal, you will receive your results only by: Marland Kitchen MyChart Message (if you have MyChart) OR . A paper copy in the mail If you have any lab test that is abnormal or we need to change your treatment, we will call you to review the results.   Testing/Procedures: None ordered   Follow-Up: At [facility], you and your health needs are our priority.  As part of our continuing mission to provide you with exceptional heart care, we have created designated Provider Care Teams.  These Care Teams include your primary Cardiologist (physician) and Advanced Practice Providers (APPs -  Physician Assistants and Nurse Practitioners) who all work together to provide you with the care you need, when you need it.  We recommend signing up for the patient portal called "MyChart".  Sign up information is provided on this After Visit Summary.  MyChart is used to connect with patients for Virtual Visits (Telemedicine).  Patients are able to view lab/test results, encounter notes, upcoming appointments, etc.  Non-urgent messages can be sent to your provider as well.   To learn more about what you can do with MyChart, go to [email or web address].    Your next appointment:   6 month(s)  The format for your next appointment:   In Person  Provider:   Loman Brooklyn, MD   Thank you for choosing [facility]!!   Dory Horn, RN [phone]    Other Instructions  Rosuvastatin Tablets What is this medicine? ROSUVASTATIN (roe SOO va sta tin) is known as a HMG-CoA reductase inhibitor or 'statin'. It lowers cholesterol and triglycerides  in the blood. This drug may also reduce the risk of heart attack, stroke, or other health problems in patients with risk factors for heart disease. Diet and lifestyle changes are often used with this drug. This medicine may be used for other purposes; ask your health care provider or pharmacist if you have questions. COMMON BRAND NAME(S): Crestor What should I tell my health care provider before I take this medicine? They need to know if you have any of these conditions:  diabetes  if you often drink alcohol  history of stroke  kidney disease  liver disease  muscle aches or weakness  thyroid disease  an unusual or allergic reaction to rosuvastatin, other medicines, foods, dyes, or preservatives  pregnant or trying to get pregnant  breast-feeding How should I use this medicine? Take this medicine by mouth with a glass of water. Follow the directions on the prescription label. Do not cut, crush or chew this medicine. You can take this medicine with or without food. Take your doses at regular intervals. Do not take your medicine more often than directed. Talk to your pediatrician regarding the use of this medicine in children. While this drug may be prescribed for children as young as 82 years old for selected conditions, precautions do apply. Overdosage: If you think you have taken too much of this medicine contact a poison control center or emergency room at once. NOTE: This medicine is only for you. Do not share this medicine with others. What if I miss a dose? If you miss  a dose, take it as soon as you can. If your next dose is to be taken in less than 12 hours, then do not take the missed dose. Take the next dose at your regular time. Do not take double or extra doses. What may interact with this medicine? Do not take this medicine with any of the following medications:  herbal medicines like red yeast rice This medicine may also interact with the following  medications:  alcohol  antacids containing aluminum hydroxide or magnesium hydroxide  cyclosporine  other medicines for high cholesterol  some medicines for HIV infection  warfarin This list may not describe all possible interactions. Give your health care provider a list of all the medicines, herbs, non-prescription drugs, or dietary supplements you use. Also tell them if you smoke, drink alcohol, or use illegal drugs. Some items may interact with your medicine. What should I watch for while using this medicine? Visit your doctor or health care professional for regular check-ups. You may need regular tests to make sure your liver is working properly. Your health care professional may tell you to stop taking this medicine if you develop muscle problems. If your muscle problems do not go away after stopping this medicine, contact your health care professional. Do not become pregnant while taking this medicine. Women should inform their health care professional if they wish to become pregnant or think they might be pregnant. There is a potential for serious side effects to an unborn child. Talk to your health care professional or pharmacist for more information. Do not breast-feed an infant while taking this medicine. This medicine may increase blood sugar. Ask your healthcare provider if changes in diet or medicines are needed if you have diabetes. If you are going to need surgery or other procedure, tell your doctor that you are using this medicine. This drug is only part of a total heart-health program. Your doctor or a dietician can suggest a low-cholesterol and low-fat diet to help. Avoid alcohol and smoking, and keep a proper exercise schedule. This medicine may cause a decrease in Co-Enzyme Q-10. You should make sure that you get enough Co-Enzyme Q-10 while you are taking this medicine. Discuss the foods you eat and the vitamins you take with your health care professional. What side  effects may I notice from receiving this medicine? Side effects that you should report to your doctor or health care professional as soon as possible:  allergic reactions like skin rash, itching or hives, swelling of the face, lips, or tongue  confusion  joint pain  loss of memory  redness, blistering, peeling or loosening of the skin, including inside the mouth  signs and symptoms of high blood sugar such as being more thirsty or hungry or having to urinate more than normal. You may also feel very tired or have blurry vision.  signs and symptoms of muscle injury like dark urine; trouble passing urine or change in the amount of urine; unusually weak or tired; muscle pain or side or back pain  yellowing of the eyes or skin Side effects that usually do not require medical attention (report to your doctor or health care professional if they continue or are bothersome):  constipation  diarrhea  dizziness  gas  headache  nausea  stomach pain  trouble sleeping  upset stomach This list may not describe all possible side effects. Call your doctor for medical advice about side effects. You may report side effects to FDA at 1-800-FDA-1088. Where should I  keep my medicine? Keep out of the reach of children. Store at room temperature between 20 and 25 degrees C (68 and 77 degrees F). Keep container tightly closed (protect from moisture). Throw away any unused medicine after the expiration date. NOTE: This sheet is a summary. It may not cover all possible information. If you have questions about this medicine, talk to your doctor, pharmacist, or health care provider.  2020 Elsevier/Gold Standard (2018-08-17 08:25:08)

## 2020-05-08 NOTE — Progress Notes (Signed)
Electrophysiology Office Note   Date:  05/08/2020   ID:  Barry [name], DOB 29-May-1956, MRN [id number]  PCP:  Daisy Floro, MD  Cardiologist:  Allyson Sabal Primary Electrophysiologist:  Nollie Terlizzi Jorja Loa, MD    No chief complaint on file.    History of Present Illness: Barry [name] is a 64 y.o. male who presents today for electrophysiology evaluation.   History of atrial fibrillation, HTN, HLD.  Diagnosed with AF in February.  TTE and stress testing normal.  On Xarelto with cardioversion in April.  In May, playing soccer with his kids, became SOB, and noted to have an irregular HR on monitor.  Has had decreased exercise tolerance.  He had a cardioversion with return back to sinus rhythm.   Today, denies symptoms of palpitations, chest pain, shortness of breath, orthopnea, PND, lower extremity edema, claudication, dizziness, presyncope, syncope, bleeding, or neurologic sequela. The patient is tolerating medications without difficulties.  Overall he is doing well.  He has no chest pain or shortness of breath.  Is able do all of his daily activities without restriction.  He does have some leg pain that he thinks may be due to his atorvastatin.  Otherwise he has no major complaints.   Past Medical History:  Diagnosis Date  . Essential hypertension   . H/O cardiovascular stress test    a. 12/2015 MV: EF 43% (nl by echo), small basal & mid inferoseptal defect - likely attenuation artifact.  . Hyperlipidemia   . Persistent atrial fibrillation (HCC)    a. Dx 12/2012; b. CHA2DS2VASc = 1-->Xarelto;  c. 12/2015 Echo: EF 50-55%, no rwma, mild AI, mildly dil Asc AO, triv MR/TR, sev dil LA, mild PI;  d. 02/2016 Successful DCCV w/ 528U; e. 04/2016 Recurrent AF.   Past Surgical History:  Procedure Laterality Date  . CARDIOVERSION N/A 02/18/2016   Procedure: CARDIOVERSION;  Surgeon: Vesta Mixer, MD;  Location: [facility] ENDOSCOPY;  Service: Cardiovascular;  Laterality: N/A;  . CARDIOVERSION N/A  06/22/2016   Procedure: CARDIOVERSION;  Surgeon: Vesta Mixer, MD;  Location: [facility] ENDOSCOPY;  Service: Cardiovascular;  Laterality: N/A;  . HERNIA REPAIR  1982     Current Outpatient Medications  Medication Sig Dispense Refill  . atorvastatin (LIPITOR) 20 MG tablet Take 20 mg by mouth daily.    . flecainide (TAMBOCOR) 100 MG tablet TAKE 1 TABLET (100 MG TOTAL) BY MOUTH 2 (TWO) TIMES DAILY. PLEASE MAKE OVERDUE APP 60 tablet 1  . ibuprofen (ADVIL,MOTRIN) 200 MG tablet Take 200 mg by mouth every 6 (six) hours as needed for moderate pain.     . metoprolol tartrate (LOPRESSOR) 25 MG tablet TAKE 1 TABLET BY MOUTH TWICE A DAY 60 tablet 1   No current facility-administered medications for this visit.    Allergies:   Patient has no known allergies.   Social History:  The patient  reports that he has never smoked. He has never used smokeless tobacco. He reports current alcohol use of about 2.0 standard drinks of alcohol per week. He reports that he does not use drugs.   Family History:  The patient's family history includes Heart attack in his father and mother; Hyperlipidemia in his father; Hypertension in his father and mother.   ROS:  Please see the history of present illness.   Otherwise, review of systems is positive for none.   All other systems are reviewed and negative.   PHYSICAL EXAM: VS:  BP (!) 144/86   Pulse (!) 59  Ht 6\' 1"  (1.854 m)   Wt 189 lb (85.7 kg)   BMI 24.94 kg/m  , BMI Body mass index is 24.94 kg/m. GEN: Well nourished, well developed, in no acute distress  HEENT: normal  Neck: no JVD, carotid bruits, or masses Cardiac: RRR; no murmurs, rubs, or gallops,no edema  Respiratory:  clear to auscultation bilaterally, normal work of breathing GI: soft, nontender, nondistended, + BS MS: no deformity or atrophy  Skin: warm and dry Neuro:  Strength and sensation are intact Psych: euthymic mood, full affect  EKG:  EKG is ordered today. Personal review of the ekg  ordered shows sinus rhythm, rate 50  Recent Labs: No results found for requested labs within last 8760 hours.    Lipid Panel  No results found for: CHOL, TRIG, HDL, CHOLHDL, VLDL, LDLCALC, LDLDIRECT   Wt Readings from Last 3 Encounters:  05/08/20 189 lb (85.7 kg)  03/22/19 184 lb (83.5 kg)  03/07/18 193 lb (87.5 kg)      Other studies Reviewed: Additional studies/ records that were reviewed today include: TTE 01/02/16 - Left ventricle: The cavity size was normal. There was moderate  concentric hypertrophy. Systolic function was normal. The  estimated ejection fraction was in the range of 50% to 55%. Wall  motion was normal; there were no regional wall motion  abnormalities. - Aortic valve: Transvalvular velocity was within the normal range.  There was no stenosis. There was mild regurgitation. - Aorta: Ascending aorta diameter: 38 mm (ED). - Ascending aorta: The ascending aorta was mildly dilated. - Mitral valve: Transvalvular velocity was within the normal range.  There was no evidence for stenosis. There was trivial  regurgitation. - Left atrium: The atrium was severely dilated. - Right ventricle: Systolic function was normal. - Tricuspid valve: There was trivial regurgitation. - Pulmonic valve: There was mild regurgitation. - Inferior vena cava: The vessel was normal in size. The  respirophasic diameter changes were in the normal range (>= 50%),  consistent with normal central venous pressure.  Myoview 01/02/16   The left ventricular ejection fraction is moderately decreased (30-44%).  Nuclear stress EF is calculated at 43% but visually appears higher.  There was no ST segment deviation noted during stress.  There is a small defect of mild severity present in the basal inferoseptal and mid inferoseptal location. The defect is non-reversible. There is a small defect of mild severity present in the basal inferior and mid inferior location. The defect is  non-reversible. Defects are most consistent with diaphragmatic attenuation. No ischemia noted.   ASSESSMENT AND PLAN:  1.  Persistent atrial fibrillation: Currently on flecainide, metoprolol.  CHA2DS2-VASc 1.  He fortunately remains in sinus rhythm.  He has had no further episodes of atrial fibrillation.  ECG monitoring of flecainide, high risk medication, shows no QRS widening.  2. Hypertension: Mildly elevated today but usually well controlled.  No changes.  3.  Hyperlipidemia: Has been having leg pain with atorvastatin.  We Jaydy Fitzhenry switch him to rosuvastatin 20 mg.  Current medicines are reviewed at length with the patient today.   The patient does not have concerns regarding his medicines.  The following changes were made today: Stop atorvastatin, start rosuvastatin  Labs/ tests ordered today include:  Orders Placed This Encounter  Procedures  . EKG 12-Lead     Disposition:   FU with Tamaira Ciriello 6 months  Signed, Toneisha Savary 01/04/16, MD  05/08/2020 10:03 AM     [facility] [address] [place]  [address] [address] [phone] (office) [phone] (fax)

## 2020-11-06 ENCOUNTER — Encounter: Payer: Self-pay | Admitting: Cardiology

## 2020-11-06 ENCOUNTER — Ambulatory Visit (INDEPENDENT_AMBULATORY_CARE_PROVIDER_SITE_OTHER): Payer: Managed Care, Other (non HMO) | Admitting: Cardiology

## 2020-11-06 ENCOUNTER — Other Ambulatory Visit: Payer: Self-pay

## 2020-11-06 VITALS — BP 142/96 | HR 62 | Ht 73.0 in | Wt 189.6 lb

## 2020-11-06 DIAGNOSIS — I480 Paroxysmal atrial fibrillation: Secondary | ICD-10-CM | POA: Diagnosis not present

## 2020-11-06 NOTE — Patient Instructions (Signed)
Medication Instructions:  Your physician recommends that you continue on your current medications as directed. Please refer to the Current Medication list given to you today.  *If you need a refill on your cardiac medications before your next appointment, please call your pharmacy*   Lab Work: None ordered If you have labs (blood work) drawn today and your tests are completely normal, you will receive your results only by: . MyChart Message (if you have MyChart) OR . A paper copy in the mail If you have any lab test that is abnormal or we need to change your treatment, we will call you to review the results.   Testing/Procedures: None ordered   Follow-Up: At [facility], you and your health needs are our priority.  As part of our continuing mission to provide you with exceptional heart care, we have created designated Provider Care Teams.  These Care Teams include your primary Cardiologist (physician) and Advanced Practice Providers (APPs -  Physician Assistants and Nurse Practitioners) who all work together to provide you with the care you need, when you need it.  We recommend signing up for the patient portal called "MyChart".  Sign up information is provided on this After Visit Summary.  MyChart is used to connect with patients for Virtual Visits (Telemedicine).  Patients are able to view lab/test results, encounter notes, upcoming appointments, etc.  Non-urgent messages can be sent to your provider as well.   To learn more about what you can do with MyChart, go to [email or web address].    Your next appointment:   1 year(s)  The format for your next appointment:   In Person  Provider:   Will Camnitz, MD   Thank you for choosing [facility]!!   Jackob Crookston, RN [phone]    Other Instructions    

## 2020-11-06 NOTE — Progress Notes (Signed)
Electrophysiology Office Note   Date:  11/06/2020   ID:  Barry [name], DOB [birth year], MRN [id number]  PCP:  Regan Lemming, MD  Cardiologist:  Allyson Sabal Primary Electrophysiologist:  Ridley Schewe Jorja Loa, MD    No chief complaint on file.    History of Present Illness: Barry [name] is a 64 y.o. male who presents today for electrophysiology evaluation.     He has a history significant for atrial fibrillation, hypertension, hyperlipidemia.  He had a normal stress test and echo.  He is currently on metoprolol and flecainide.  He is not anticoagulated due to a low stroke risk.  Today, denies symptoms of palpitations, chest pain, shortness of breath, orthopnea, PND, lower extremity edema, claudication, dizziness, presyncope, syncope, bleeding, or neurologic sequela. The patient is tolerating medications without difficulties.  Since last being seen he has done well.  He has no chest pain or shortness of breath.  He is able to do all of his daily activities without restriction.   Past Medical History:  Diagnosis Date  . Essential hypertension   . H/O cardiovascular stress test    a. 12/2015 MV: EF 43% (nl by echo), small basal & mid inferoseptal defect - likely attenuation artifact.  . Hyperlipidemia   . Persistent atrial fibrillation (HCC)    a. Dx 12/2012; b. CHA2DS2VASc = 1-->Xarelto;  c. 12/2015 Echo: EF 50-55%, no rwma, mild AI, mildly dil Asc AO, triv MR/TR, sev dil LA, mild PI;  d. 02/2016 Successful DCCV w/ 808U; e. 04/2016 Recurrent AF.   Past Surgical History:  Procedure Laterality Date  . CARDIOVERSION N/A 02/18/2016   Procedure: CARDIOVERSION;  Surgeon: Vesta Mixer, MD;  [facility] ENDOSCOPY;  Service: Cardiovascular;  Laterality: N/A;  . CARDIOVERSION N/A 06/22/2016   Procedure: CARDIOVERSION;  Surgeon: Vesta Mixer, MD;  Location[facility] ENDOSCOPY;  Service: Cardiovascular;  Laterality: N/A;  . HERNIA REPAIR  1982     Current Outpatient Medications   Medication Sig Dispense Refill  . flecainide (TAMBOCOR) 100 MG tablet Take 1 tablet (100 mg total) by mouth 2 (two) times daily. 180 tablet 1  . ibuprofen (ADVIL,MOTRIN) 200 MG tablet Take 200 mg by mouth every 6 (six) hours as needed for moderate pain.     . metoprolol tartrate (LOPRESSOR) 25 MG tablet Take 1 tablet (25 mg total) by mouth 2 (two) times daily. 180 tablet 1  . rosuvastatin (CRESTOR) 20 MG tablet Take 1 tablet (20 mg total) by mouth daily. 30 tablet 6   No current facility-administered medications for this visit.    Allergies:   Patient has no known allergies.   Social History:  The patient  reports that he has never smoked. He has never used smokeless tobacco. He reports current alcohol use of about 2.0 standard drinks of alcohol per week. He reports that he does not use drugs.   Family History:  The patient's family history includes Heart attack in his father and mother; Hyperlipidemia in his father; Hypertension in his father and mother.   ROS:  Please see the history of present illness.   Otherwise, review of systems is positive for none.   All other systems are reviewed and negative.   PHYSICAL EXAM: VS:  BP (!) 142/96   Pulse 62   Ht 6\' 1"  (1.854 m)   Wt 189 lb 9.6 oz (86 kg)   SpO2 96%   BMI 25.01 kg/m  , BMI Body mass index is 25.01 kg/m. GEN: Well nourished, well  developed, in no acute distress  HEENT: normal  Neck: no JVD, carotid bruits, or masses Cardiac: RRR; no murmurs, rubs, or gallops,no edema  Respiratory:  clear to auscultation bilaterally, normal work of breathing GI: soft, nontender, nondistended, + BS MS: no deformity or atrophy  Skin: warm and dry Neuro:  Strength and sensation are intact Psych: euthymic mood, full affect  EKG:  EKG is ordered today. Personal review of the ekg ordered shows sinus rhythm, rate 62  Recent Labs: No results found for requested labs within last 8760 hours.    Lipid Panel  No results found for: CHOL, TRIG,  HDL, CHOLHDL, VLDL, LDLCALC, LDLDIRECT   Wt Readings from Last 3 Encounters:  11/06/20 189 lb 9.6 oz (86 kg)  05/08/20 189 lb (85.7 kg)  03/22/19 184 lb (83.5 kg)      Other studies Reviewed: Additional studies/ records that were reviewed today include: TTE 01/02/16 - Left ventricle: The cavity size was normal. There was moderate  concentric hypertrophy. Systolic function was normal. The  estimated ejection fraction was in the range of 50% to 55%. Wall  motion was normal; there were no regional wall motion  abnormalities. - Aortic valve: Transvalvular velocity was within the normal range.  There was no stenosis. There was mild regurgitation. - Aorta: Ascending aorta diameter: 38 mm (ED). - Ascending aorta: The ascending aorta was mildly dilated. - Mitral valve: Transvalvular velocity was within the normal range.  There was no evidence for stenosis. There was trivial  regurgitation. - Left atrium: The atrium was severely dilated. - Right ventricle: Systolic function was normal. - Tricuspid valve: There was trivial regurgitation. - Pulmonic valve: There was mild regurgitation. - Inferior vena cava: The vessel was normal in size. The  respirophasic diameter changes were in the normal range (>= 50%),  consistent with normal central venous pressure.  Myoview 01/02/16   The left ventricular ejection fraction is moderately decreased (30-44%).  Nuclear stress EF is calculated at 43% but visually appears higher.  There was no ST segment deviation noted during stress.  There is a small defect of mild severity present in the basal inferoseptal and mid inferoseptal location. The defect is non-reversible. There is a small defect of mild severity present in the basal inferior and mid inferior location. The defect is non-reversible. Defects are most consistent with diaphragmatic attenuation. No ischemia noted.   ASSESSMENT AND PLAN:  1.  Persistent atrial fibrillation: Currently  on flecainide and metoprolol.  CHA2DS2-VASc of 1 and not not anticoagulated.  Monitoring for high risk medication.  He is currently feeling well.  No changes.  2.  Hypertension: Blood pressure is elevated today.  He says that he has not been checking it at home.  I have asked him to check it over the next few weeks and let us know if it remains elevated.  3.  Hyperlipidemia: Continue Crestor.  Current medicines are reviewed at length with the patient today.   The patient does not have concerns regarding his medicines.  The following changes were made today: None  Labs/ tests ordered today include:  Orders Placed This Encounter  Procedures  . EKG 12-Lead     Disposition:   FU with Anntonette Madewell 12 months  Signed, Yailyn Strack Jorja Loa, MD  11/06/2020 8:56 AM     [facility] [address] [place] [address] [phone] (office) [phone] (fax)

## 2020-11-18 ENCOUNTER — Other Ambulatory Visit: Payer: Self-pay | Admitting: Cardiology

## 2021-06-11 ENCOUNTER — Encounter (HOSPITAL_COMMUNITY): Payer: Self-pay | Admitting: Emergency Medicine

## 2021-06-11 ENCOUNTER — Inpatient Hospital Stay (HOSPITAL_COMMUNITY)
Admission: EM | Admit: 2021-06-11 | Discharge: 2021-06-14 | DRG: 854 | Disposition: A | Discharge status: Home / Self Care | Payer: Managed Care, Other (non HMO) | Attending: Internal Medicine | Admitting: Internal Medicine

## 2021-06-11 ENCOUNTER — Other Ambulatory Visit: Payer: Self-pay

## 2021-06-11 DIAGNOSIS — I10 Essential (primary) hypertension: Secondary | ICD-10-CM | POA: Diagnosis present

## 2021-06-11 DIAGNOSIS — Z83438 Family history of other disorder of lipoprotein metabolism and other lipidemia: Secondary | ICD-10-CM

## 2021-06-11 DIAGNOSIS — K611 Rectal abscess: Principal | ICD-10-CM

## 2021-06-11 DIAGNOSIS — K612 Anorectal abscess: Secondary | ICD-10-CM | POA: Diagnosis present

## 2021-06-11 DIAGNOSIS — I4819 Other persistent atrial fibrillation: Secondary | ICD-10-CM | POA: Diagnosis present

## 2021-06-11 DIAGNOSIS — Z20822 Contact with and (suspected) exposure to covid-19: Secondary | ICD-10-CM | POA: Diagnosis present

## 2021-06-11 DIAGNOSIS — I959 Hypotension, unspecified: Secondary | ICD-10-CM | POA: Diagnosis present

## 2021-06-11 DIAGNOSIS — A419 Sepsis, unspecified organism: Secondary | ICD-10-CM | POA: Diagnosis not present

## 2021-06-11 DIAGNOSIS — Z8249 Family history of ischemic heart disease and other diseases of the circulatory system: Secondary | ICD-10-CM

## 2021-06-11 DIAGNOSIS — E871 Hypo-osmolality and hyponatremia: Secondary | ICD-10-CM | POA: Diagnosis present

## 2021-06-11 DIAGNOSIS — Z79899 Other long term (current) drug therapy: Secondary | ICD-10-CM

## 2021-06-11 DIAGNOSIS — I4891 Unspecified atrial fibrillation: Secondary | ICD-10-CM | POA: Diagnosis present

## 2021-06-11 DIAGNOSIS — R000 Tachycardia, unspecified: Secondary | ICD-10-CM | POA: Diagnosis not present

## 2021-06-11 DIAGNOSIS — K5900 Constipation, unspecified: Secondary | ICD-10-CM | POA: Diagnosis present

## 2021-06-11 DIAGNOSIS — E785 Hyperlipidemia, unspecified: Secondary | ICD-10-CM | POA: Diagnosis present

## 2021-06-11 LAB — CBC WITH DIFFERENTIAL/PLATELET
Abs Immature Granulocytes: 0.09 10*3/uL — ABNORMAL HIGH (ref 0.00–0.07)
Basophils Absolute: 0 10*3/uL (ref 0.0–0.1)
Basophils Relative: 0 %
Eosinophils Absolute: 0 10*3/uL (ref 0.0–0.5)
Eosinophils Relative: 0 %
HCT: 37.3 % — ABNORMAL LOW (ref 39.0–52.0)
Hemoglobin: 12.7 g/dL — ABNORMAL LOW (ref 13.0–17.0)
Immature Granulocytes: 1 %
Lymphocytes Relative: 6 %
Lymphs Abs: 1 10*3/uL (ref 0.7–4.0)
MCH: 30.8 pg (ref 26.0–34.0)
MCHC: 34 g/dL (ref 30.0–36.0)
MCV: 90.3 fL (ref 80.0–100.0)
Monocytes Absolute: 1 10*3/uL (ref 0.1–1.0)
Monocytes Relative: 6 %
Neutro Abs: 13.5 10*3/uL — ABNORMAL HIGH (ref 1.7–7.7)
Neutrophils Relative %: 87 %
Platelets: 177 10*3/uL (ref 150–400)
RBC: 4.13 MIL/uL — ABNORMAL LOW (ref 4.22–5.81)
RDW: 11.7 % (ref 11.5–15.5)
WBC: 15.5 10*3/uL — ABNORMAL HIGH (ref 4.0–10.5)
nRBC: 0 % (ref 0.0–0.2)

## 2021-06-11 LAB — BASIC METABOLIC PANEL
Anion gap: 8 (ref 5–15)
BUN: 17 mg/dL (ref 8–23)
CO2: 27 mmol/L (ref 22–32)
Calcium: 9 mg/dL (ref 8.9–10.3)
Chloride: 97 mmol/L — ABNORMAL LOW (ref 98–111)
Creatinine, Ser: 1.07 mg/dL (ref 0.61–1.24)
GFR, Estimated: >60 mL/min (ref >60)
Glucose, Bld: 188 mg/dL — ABNORMAL HIGH (ref 70–99)
Potassium: 3.6 mmol/L (ref 3.5–5.1)
Sodium: 132 mmol/L — ABNORMAL LOW (ref 135–145)

## 2021-06-11 MED ORDER — MORPHINE SULFATE (PF) 4 MG/ML IV SOLN
4.0000 mg | Freq: Once | INTRAVENOUS | Status: AC
  Administered 2021-06-11: 4 mg via INTRAVENOUS
  Filled 2021-06-11: qty 1

## 2021-06-11 MED ORDER — ONDANSETRON HCL 4 MG/2ML IJ SOLN
4.0000 mg | Freq: Once | INTRAMUSCULAR | Status: AC
  Administered 2021-06-11: 4 mg via INTRAVENOUS
  Filled 2021-06-11: qty 2

## 2021-06-11 NOTE — ED Provider Notes (Signed)
Emergency Medicine Provider Triage Evaluation Note  Barry [name] , a 65 y.o. male  was evaluated in triage.  Pt complains of abscess.  Patient was seen at [facility] walk-in clinic earlier today and was told that this appeared to be a surgical needed and he would need to go to the emergency department.  Complains of 2 days of worsening pain and swelling around his anus and pain in his rectum with defecation.  He denies fevers or chills. Review of Systems  Positive: Rectal pain Negative: Fever or chills  Physical Exam  There were no vitals taken for this visit. Gen:   Awake, no distress   Resp:  Normal effort  MSK:   Moves extremities without difficulty  Other:  Uncomfortable, sitting on hip with bottom off the chair  Medical Decision Making  Medically screening exam initiated at 8:36 PM.  Appropriate orders placed.  Chuck Hint was informed that the remainder of the evaluation will be completed by another provider, this initial triage assessment does not replace that evaluation, and the importance of remaining in the ED until their evaluation is complete.  Rectal pain. Ct pelvis, labs placed,   Arthor Captain, PA-C 06/11/21 2043    Melene Plan, DO 06/11/21 2205

## 2021-06-11 NOTE — ED Triage Notes (Signed)
Patient reports rectal pain with swelling onset this week , seen at [facility] walk in clinic advised to go to ER for further treatment. No fever or chills .

## 2021-06-12 ENCOUNTER — Emergency Department (HOSPITAL_COMMUNITY): Payer: Managed Care, Other (non HMO)

## 2021-06-12 DIAGNOSIS — K611 Rectal abscess: Secondary | ICD-10-CM | POA: Diagnosis present

## 2021-06-12 DIAGNOSIS — A419 Sepsis, unspecified organism: Secondary | ICD-10-CM | POA: Diagnosis not present

## 2021-06-12 LAB — URINALYSIS, ROUTINE W REFLEX MICROSCOPIC
Bacteria, UA: NONE SEEN
Bilirubin Urine: NEGATIVE
Glucose, UA: 50 mg/dL — AB
Ketones, ur: 20 mg/dL — AB
Leukocytes,Ua: NEGATIVE
Nitrite: NEGATIVE
Protein, ur: 30 mg/dL — AB
Specific Gravity, Urine: >1.046 — ABNORMAL HIGH (ref 1.005–1.030)
pH: 5 (ref 5.0–8.0)

## 2021-06-12 LAB — APTT: aPTT: 31 seconds (ref 24–36)

## 2021-06-12 LAB — RESP PANEL BY RT-PCR (FLU A&B, COVID) ARPGX2
Influenza A by PCR: NEGATIVE
Influenza B by PCR: NEGATIVE
SARS Coronavirus 2 by RT PCR: NEGATIVE

## 2021-06-12 LAB — PROTIME-INR
INR: 1.4 — ABNORMAL HIGH (ref 0.8–1.2)
Prothrombin Time: 17.4 seconds — ABNORMAL HIGH (ref 11.4–15.2)

## 2021-06-12 LAB — HEMOGLOBIN A1C
Hgb A1c MFr Bld: 5.6 % (ref 4.8–5.6)
Mean Plasma Glucose: 114.02 mg/dL

## 2021-06-12 LAB — LACTIC ACID, PLASMA: Lactic Acid, Venous: 1 mmol/L (ref 0.5–1.9)

## 2021-06-12 MED ORDER — AMOXICILLIN-POT CLAVULANATE 875-125 MG PO TABS: 1.0000 | ORAL_TABLET | Freq: Two times a day (BID) | ORAL | 0 refills | Status: DC

## 2021-06-12 MED ORDER — LIDOCAINE HCL 1 % IJ SOLN: 15.0000 mL | Freq: Once | INTRAMUSCULAR | Status: DC

## 2021-06-12 MED ORDER — HYDROCODONE-ACETAMINOPHEN 5-325 MG PO TABS
1.0000 | ORAL_TABLET | ORAL | Status: DC | PRN
  Administered 2021-06-12 – 2021-06-13 (×3): 2 via ORAL
  Filled 2021-06-12 (×3): qty 2

## 2021-06-12 MED ORDER — IOHEXOL 300 MG/ML  SOLN
100.0000 mL | Freq: Once | INTRAMUSCULAR | Status: AC | PRN
  Administered 2021-06-12: 100 mL via INTRAVENOUS

## 2021-06-12 MED ORDER — ACETAMINOPHEN 650 MG RE SUPP: 650.0000 mg | Freq: Four times a day (QID) | RECTAL | Status: DC | PRN

## 2021-06-12 MED ORDER — VANCOMYCIN HCL IN DEXTROSE 1-5 GM/200ML-% IV SOLN
1000.0000 mg | Freq: Once | INTRAVENOUS | Status: DC
  Filled 2021-06-12: qty 200

## 2021-06-12 MED ORDER — LIDOCAINE HCL (PF) 1 % IJ SOLN
15.0000 mL | Freq: Once | INTRAMUSCULAR | Status: AC
  Administered 2021-06-12: 15 mL via INTRADERMAL
  Filled 2021-06-12: qty 15

## 2021-06-12 MED ORDER — METOPROLOL TARTRATE 25 MG PO TABS
25.0000 mg | ORAL_TABLET | Freq: Once | ORAL | Status: AC
  Administered 2021-06-12: 25 mg via ORAL
  Filled 2021-06-12: qty 1

## 2021-06-12 MED ORDER — FENTANYL CITRATE (PF) 100 MCG/2ML IJ SOLN
100.0000 ug | Freq: Once | INTRAMUSCULAR | Status: AC
Start: 2021-06-12 — End: 2021-06-12
  Administered 2021-06-12: 100 ug via INTRAVENOUS
  Filled 2021-06-12: qty 2

## 2021-06-12 MED ORDER — VANCOMYCIN HCL 1250 MG/250ML IV SOLN
1250.0000 mg | Freq: Two times a day (BID) | INTRAVENOUS | Status: DC
  Administered 2021-06-13 – 2021-06-14 (×3): 1250 mg via INTRAVENOUS
  Filled 2021-06-12 (×3): qty 250

## 2021-06-12 MED ORDER — FLECAINIDE ACETATE 100 MG PO TABS
100.0000 mg | ORAL_TABLET | Freq: Once | ORAL | Status: AC
  Administered 2021-06-12: 100 mg via ORAL
  Filled 2021-06-12: qty 1

## 2021-06-12 MED ORDER — SODIUM CHLORIDE 0.9 % IV SOLN
2.0000 g | Freq: Once | INTRAVENOUS | Status: AC
  Administered 2021-06-12: 2 g via INTRAVENOUS
  Filled 2021-06-12: qty 20

## 2021-06-12 MED ORDER — ACETAMINOPHEN 500 MG PO TABS
1000.0000 mg | ORAL_TABLET | Freq: Once | ORAL | Status: AC
  Administered 2021-06-12: 1000 mg via ORAL
  Filled 2021-06-12: qty 2

## 2021-06-12 MED ORDER — PIPERACILLIN-TAZOBACTAM 3.375 G IVPB
3.3750 g | Freq: Three times a day (TID) | INTRAVENOUS | Status: DC
  Administered 2021-06-12 – 2021-06-14 (×6): 3.375 g via INTRAVENOUS
  Filled 2021-06-12 (×7): qty 50

## 2021-06-12 MED ORDER — ACETAMINOPHEN 325 MG PO TABS
650.0000 mg | ORAL_TABLET | Freq: Four times a day (QID) | ORAL | Status: DC | PRN
  Administered 2021-06-13 – 2021-06-14 (×2): 650 mg via ORAL
  Filled 2021-06-12 (×2): qty 2

## 2021-06-12 MED ORDER — VANCOMYCIN HCL 2000 MG/400ML IV SOLN
2000.0000 mg | Freq: Once | INTRAVENOUS | Status: AC
  Administered 2021-06-12: 2000 mg via INTRAVENOUS
  Filled 2021-06-12: qty 400

## 2021-06-12 MED ORDER — SODIUM CHLORIDE 0.9 % IV BOLUS
1000.0000 mL | Freq: Once | INTRAVENOUS | Status: AC
  Administered 2021-06-12: 1000 mL via INTRAVENOUS

## 2021-06-12 MED ORDER — LACTATED RINGERS IV SOLN: INTRAVENOUS | Status: AC

## 2021-06-12 NOTE — Sepsis Progress Note (Signed)
Sepsis protocol followed by eLink 

## 2021-06-12 NOTE — Consult Note (Signed)
Consult Note  Barry [name] [birth year]  [id number].    Requesting MD: Frederik Pear PA-C Chief Complaint/Reason for Consult: perirectal abscess  HPI:  65 year old male with past medical history significant for atrial fibrillation, hypertension, hyperlipidemia who presents to [facility] ED secondary to an abscess near his rectum and associated pain. He was seen at [facility] walk in clinic and was directed to the ED. He states the pain and swelling began about 5 days ago and has been getting worse. It is worsened with bowel movements. He has associated constipation because of this. ROS otherwise as below.  Work up in ED significant for elevated WBC of 15.5 and CT scan showing "abscess surrounding the anus from the 12 o'clock to 9 o'clock position measuring at least 3.7 x 2.2 x 4.5 cm in greatest dimension." CT scan personally reviewed  General surgery was asked to see for incision and drainage of abscess  ROS: Review of Systems  Constitutional:  Positive for chills. Negative for fever.  Respiratory:  Negative for cough and shortness of breath.   Cardiovascular:  Negative for chest pain and leg swelling.  Gastrointestinal:  Positive for constipation. Negative for abdominal pain, diarrhea, nausea and vomiting.   Family History  Problem Relation Age of Onset   Hypertension Mother    Heart attack Mother    Hypertension Father    Hyperlipidemia Father    Heart attack Father     Past Medical History:  Diagnosis Date   Essential hypertension    H/O cardiovascular stress test    a. 12/2015 MV: EF 43% (nl by echo), small basal & mid inferoseptal defect - likely attenuation artifact.   Hyperlipidemia    Persistent atrial fibrillation (HCC)    a. Dx 12/2012; b. CHA2DS2VASc = 1-->Xarelto;  c. 12/2015 Echo: EF 50-55%, no rwma, mild AI, mildly dil Asc AO, triv MR/TR, sev dil LA, mild PI;  d. 02/2016 Successful DCCV w/ 983J; e. 04/2016 Recurrent AF.    Past Surgical History:  Procedure Laterality  Date   CARDIOVERSION N/A 02/18/2016   Procedure: CARDIOVERSION;  Surgeon: Vesta Mixer, MD;  Location: [facility] ENDOSCOPY;  Service: Cardiovascular;  Laterality: N/A;   CARDIOVERSION N/A 06/22/2016   Procedure: CARDIOVERSION;  Surgeon: Vesta Mixer, MD;  Location: [facility] ENDOSCOPY;  Service: Cardiovascular;  Laterality: N/A;   HERNIA REPAIR  1982    Social History:  reports that he has never smoked. He has never used smokeless tobacco. He reports current alcohol use of about 2.0 standard drinks of alcohol per week. He reports that he does not use drugs.  Allergies: No Known Allergies  (Not in a hospital admission)   Blood pressure 140/72, pulse 88, temperature 98.6 F (37 C), temperature source Oral, resp. rate 18, height 6\' 1"  (1.854 m), weight 92 kg, SpO2 99 %. Physical Exam:  General: pleasant, WD, male who is laying in bed in NAD HEENT: head is normocephalic, atraumatic.  Sclera are noninjected.  Pupils equal and round.  Ears and nose without any masses or lesions. Heart: tachycardic. regular rhythm. Palpable radial pulses bilaterally Lungs: Respiratory effort nonlabored on room air Abd: soft, ND GU: fluctuance, induration, and erythema located superior to anus with significant pain to palpation. External hemorrhoids present without evidence of thrombosis MS: all 4 extremities are symmetrical with no cyanosis, clubbing, or edema. Skin: warm and dry Neuro: Cranial nerves 2-12 grossly intact, sensation is normal throughout Psych: A&Ox3 with an appropriate affect.   Results for orders placed  or performed during the hospital encounter of 06/11/21 (from the past 48 hour(s))  CBC with Differential     Status: Abnormal   Collection Time: 06/11/21  8:49 PM  Result Value Ref Range   WBC 15.5 (H) 4.0 - 10.5 K/uL   RBC 4.13 (L) 4.22 - 5.81 MIL/uL   Hemoglobin 12.7 (L) 13.0 - 17.0 g/dL   HCT 63.0 (L) 16.0 - 10.9 %   MCV 90.3 80.0 - 100.0 fL   MCH 30.8 26.0 - 34.0 pg   MCHC 34.0 30.0 - 36.0  g/dL   RDW 32.3 55.7 - 32.2 %   Platelets 177 150 - 400 K/uL   nRBC 0.0 0.0 - 0.2 %   Neutrophils Relative % 87 %   Neutro Abs 13.5 (H) 1.7 - 7.7 K/uL   Lymphocytes Relative 6 %   Lymphs Abs 1.0 0.7 - 4.0 K/uL   Monocytes Relative 6 %   Monocytes Absolute 1.0 0.1 - 1.0 K/uL   Eosinophils Relative 0 %   Eosinophils Absolute 0.0 0.0 - 0.5 K/uL   Basophils Relative 0 %   Basophils Absolute 0.0 0.0 - 0.1 K/uL   Immature Granulocytes 1 %   Abs Immature Granulocytes 0.09 (H) 0.00 - 0.07 K/uL    Comment: Performed at [facility], [address], [place], [place] [address]  Basic metabolic panel     Status: Abnormal   Collection Time: 06/11/21  8:49 PM  Result Value Ref Range   Sodium 132 (L) 135 - 145 mmol/L   Potassium 3.6 3.5 - 5.1 mmol/L   Chloride 97 (L) 98 - 111 mmol/L   CO2 27 22 - 32 mmol/L   Glucose, Bld 188 (H) 70 - 99 mg/dL    Comment: Glucose reference range applies only to samples taken after fasting for at least 8 hours.   BUN 17 8 - 23 mg/dL   Creatinine, Ser 7.06 0.61 - 1.24 mg/dL   Calcium 9.0 8.9 - 23.7 mg/dL   GFR, Estimated >62 >83 mL/min    Comment: (NOTE) Calculated using the CKD-EPI Creatinine Equation (2021)    Anion gap 8 5 - 15    Comment: Performed at [facility], [address], [place], [place] [address]  Urinalysis, Routine w reflex microscopic Urine, Clean Catch     Status: Abnormal   Collection Time: 06/12/21  6:25 AM  Result Value Ref Range   Color, Urine YELLOW YELLOW   APPearance HAZY (A) CLEAR   Specific Gravity, Urine >1.046 (H) 1.005 - 1.030   pH 5.0 5.0 - 8.0   Glucose, UA 50 (A) NEGATIVE mg/dL   Hgb urine dipstick SMALL (A) NEGATIVE   Bilirubin Urine NEGATIVE NEGATIVE   Ketones, ur 20 (A) NEGATIVE mg/dL   Protein, ur 30 (A) NEGATIVE mg/dL   Nitrite NEGATIVE NEGATIVE   Leukocytes,Ua NEGATIVE NEGATIVE   RBC / HPF 0-5 0 - 5 RBC/hpf   WBC, UA 0-5 0 - 5 WBC/hpf   Bacteria, UA NONE SEEN NONE SEEN   Squamous Epithelial / LPF  0-5 0 - 5   Mucus PRESENT     Comment: Performed at [facility], [address] [place], [place] [address]   CT PELVIS W CONTRAST  Result Date: 06/12/2021 CLINICAL DATA:  Abscess, anal or rectal EXAM: CT PELVIS WITH CONTRAST TECHNIQUE: Multidetector CT imaging of the pelvis was performed using the standard protocol following the bolus administration of intravenous contrast. CONTRAST:  OMNIPAQUE IOHEXOL  300 MG/ML  SOLN COMPARISON:  None FINDINGS: Urinary Tract:  No abnormality visualized. Bowel: There is extensive inflammatory change involving the perianal soft tissues which appear expanded secondary in keeping with a perianal abscess. The fluid component appears multiloculated and circumferentially encompasses the anus from the 12 o'clock to 9 o'clock position. The fluid component is difficult to accurately measure because of its curvilinear configuration, however, this measures at least 3.7 x 2.2 x 4.5 cm on axial image # 136 and coronal reformat # 111. The inflammatory changes extend to the level of the levator ani, however, but do not appear to extend into the peritoneal cavity. The visualized bowel is unremarkable. No evidence of obstruction. No free intraperitoneal fluid within the pelvis. Vascular/Lymphatic: Mild atherosclerotic calcification within the iliac arteries. The abdominal vasculature is otherwise unremarkable. No pathologic adenopathy within the abdomen and pelvis. Reproductive: The prostate gland is unremarkable. Seminal vesicles are unremarkable. Other: Tiny bilateral fat containing inguinal hernias are present. Tiny umbilical fat containing hernia noted. Musculoskeletal: No acute bone abnormality. No lytic or blastic bone lesion. IMPRESSION: Extensive perianal inflammatory change with superimposed abscess surrounding the anus from the 12 o'clock to 9 o'clock position measuring at least 3.7 x 2.2 x 4.5 cm in greatest dimension. Inflammatory changes extend up to the level of the  levator ani without extending superiorly. Electronically Signed   By: Helyn Numbers MD   On: 06/12/2021 00:23      Assessment/Plan Perirectal abscess  - bedside incision and drainage performed - see separate procedure note - patient okay for discharge from surgical perspective on 3 days of augmentin and follow up with our clinic next week. - wound care, packing removal, and instructions regarding sitz baths discussed with patient and information provided in discharge paperwork   Eric Form, [facility] 06/12/2021, 7:46 AM Please see Amion for pager number during day hours 7:00am-4:30pm

## 2021-06-12 NOTE — Procedures (Signed)
Incision and Drainage Procedure Note  Pre-operative Diagnosis: perirectal abscess  Post-operative Diagnosis: same  Indications: perirectal abscess  Anesthesia: 1% plain lidocaine  Procedure Details  The procedure, risks and complications have been discussed in detail (including, but not limited to pain, infection, bleeding) with the patient, and the patient signed consent to the procedure.  The skin was sterilely prepped and draped over the affected area in the usual fashion. After adequate local anesthesia (15 ml 1% lidocaine), I&D with a #11 blade was performed on the left lateral superior perirectal region. Cruciate incision performed and abscess cavity explored with kelly forceps to break up loculations. There was immediate return of copious blood tinged  purulent drainage. Abscess cavity packed with 1/4" iodoform packing and 4x4 guaze and abd dressing placed. The patient was observed until stable.  Findings: Fluctuant abscess with copious purulent drainage expressed from abscess cavity.  EBL: 5 cc's  Drains: none  Condition: Tolerated procedure well   Complications: none.  Dr. Janee Morn was present throughout and assisted with procedure.

## 2021-06-12 NOTE — Discharge Instructions (Addendum)
Take acetaminophen (Tylenol) or ibuprofen (Advil) as needed for pain.  WHEN TO CALL YOUR DOCTOR: Fever over 101.0 Inability to urinate Worsening bleeding, drainage or pain at incision.  The clinic staff is available to answer your questions during regular business hours.  Please don't hesitate to call and ask to speak to one of the nurses for clinical concerns.  If you have a medical emergency, go to the nearest emergency room or call 911.  A surgeon from [facility] is always on call at the hospital. [address], [place], [place]  [address] ? [address] [address], [place], [place]   [address] [phone] ? [phone] ? FAX [phone] Web site: [email or web address]

## 2021-06-12 NOTE — Progress Notes (Addendum)
Pharmacy Antibiotic Note  Barry [name] is a 65 y.o. male admitted on 06/11/2021 with sepsis.  Pharmacy has been consulted for vancomycin dosing. Patient presented with a perirectal abscess that developed 5 days ago with pain and swelling. General surgery performed I&D in the ED. BP non-responsive to 2L NS. BP 104/59, WBC 15.5, and temp 99.2.  Plan: Vancomycin 2000 mg IV x 1 load Vancomycin 1250 mg IV Q 12 hrs. Goal AUC 400-550. Expected AUC: 527.5 SCr used: 1.07 Ceftriaxone 2 g IV x1 Zosyn 3.375 g q8h, over 4h infusion Monitor renal function, clinical status, and abx plan. Get vanc levels as clinically indicated.    Height: 6\' 1"  (185.4 cm) Weight: 92 kg (202 lb 13.2 oz) IBW/kg (Calculated) : 79.9  Temp (24hrs), Avg:98.9 F (37.2 C), Min:98.6 F (37 C), Max:99.2 F (37.3 C)  Recent Labs  Lab 06/11/21 2049  WBC 15.5*  CREATININE 1.07    Estimated Creatinine Clearance: 80.9 mL/min (by C-G formula based on SCr of 1.07 mg/dL).    No Known Allergies  Antimicrobials this admission: Ceftriaxone 8/5 x 1 Vancomycin 8/5 >>  Zosyn 8/5 >>  Microbiology results: 8/5 BCx: collected 8/5 UCx: collected    Thank you for allowing pharmacy to be a part of this patient's care.  10/5, PharmD PGY1 Pharmacy Resident  Please check AMION for all [facility] pharmacy phone numbers After 10:00 PM call main pharmacy [phone]

## 2021-06-12 NOTE — ED Provider Notes (Addendum)
Received signout at the beginning of shift, please see provider's note for complete H&P.  This is a 65 year old male presenting for evaluation of a perirectal abscess that has been ongoing for the past 5 days.  General surgery was consulted and did perform incision and drainage in the ER.    Patient does have history of atrial fibrillation currently on flecainide and metoprolol.  He has not had his morning medication yet and at this time his heart rate is in the 150s.  We will give his morning medication.  11:20 AM Nurse notified that patient is hypertensive with a blood pressure of 85/56 with a heart rate of 150.  Patient have not received his medication yet.  On exam he endorsed a mild headache but otherwise denies any significant chest pain lightheadedness or dizziness.  Will provide a IV fluid, Tylenol for headache, flecainide and metoprolol given and will monitor closely.  1:27 PM Despite receiving 2 L of normal saline, blood pressure is in the 80s systolic, heart rate has since improved.  Given no significant improvement of his blood pressure and concerns for potential sepsis, will consult medicine for admission. Code sepsis activated. Broad spectrum abx started.    1:51 PM Appreciate consultation from [facility] Dr. Artis Flock who agrees to see and will admit pt for further care.  Anticipate observation status.    .Critical Care  Date/Time: 06/12/2021 1:52 PM Performed by: Fayrene Helper, PA-C Authorized by: Fayrene Helper, PA-C   Critical care provider statement:    Critical care time (minutes):  32   Critical care was time spent personally by me on the following activities:  Discussions with consultants, evaluation of patient's response to treatment, examination of patient, ordering and performing treatments and interventions, ordering and review of laboratory studies, ordering and review of radiographic studies, pulse oximetry, re-evaluation of patient's condition, obtaining history from  patient or surrogate and review of old charts   BP 101/69   Pulse 97   Temp 99.2 F (37.3 C) (Oral)   Resp 18   Ht 6\' 1"  (1.854 m)   Wt 92 kg   SpO2 96%   BMI 26.76 kg/m   Results for orders placed or performed during the hospital encounter of 06/11/21  CBC with Differential  Result Value Ref Range   WBC 15.5 (H) 4.0 - 10.5 K/uL   RBC 4.13 (L) 4.22 - 5.81 MIL/uL   Hemoglobin 12.7 (L) 13.0 - 17.0 g/dL   HCT 08/11/21 (L) 24.5 - 80.9 %   MCV 90.3 80.0 - 100.0 fL   MCH 30.8 26.0 - 34.0 pg   MCHC 34.0 30.0 - 36.0 g/dL   RDW 98.3 38.2 - 50.5 %   Platelets 177 150 - 400 K/uL   nRBC 0.0 0.0 - 0.2 %   Neutrophils Relative % 87 %   Neutro Abs 13.5 (H) 1.7 - 7.7 K/uL   Lymphocytes Relative 6 %   Lymphs Abs 1.0 0.7 - 4.0 K/uL   Monocytes Relative 6 %   Monocytes Absolute 1.0 0.1 - 1.0 K/uL   Eosinophils Relative 0 %   Eosinophils Absolute 0.0 0.0 - 0.5 K/uL   Basophils Relative 0 %   Basophils Absolute 0.0 0.0 - 0.1 K/uL   Immature Granulocytes 1 %   Abs Immature Granulocytes 0.09 (H) 0.00 - 0.07 K/uL  Basic metabolic panel  Result Value Ref Range   Sodium 132 (L) 135 - 145 mmol/L   Potassium 3.6 3.5 - 5.1 mmol/L   Chloride  97 (L) 98 - 111 mmol/L   CO2 27 22 - 32 mmol/L   Glucose, Bld 188 (H) 70 - 99 mg/dL   BUN 17 8 - 23 mg/dL   Creatinine, Ser 3.61 0.61 - 1.24 mg/dL   Calcium 9.0 8.9 - 44.3 mg/dL   GFR, Estimated >15 >40 mL/min   Anion gap 8 5 - 15  Urinalysis, Routine w reflex microscopic Urine, Clean Catch  Result Value Ref Range   Color, Urine YELLOW YELLOW   APPearance HAZY (A) CLEAR   Specific Gravity, Urine >1.046 (H) 1.005 - 1.030   pH 5.0 5.0 - 8.0   Glucose, UA 50 (A) NEGATIVE mg/dL   Hgb urine dipstick SMALL (A) NEGATIVE   Bilirubin Urine NEGATIVE NEGATIVE   Ketones, ur 20 (A) NEGATIVE mg/dL   Protein, ur 30 (A) NEGATIVE mg/dL   Nitrite NEGATIVE NEGATIVE   Leukocytes,Ua NEGATIVE NEGATIVE   RBC / HPF 0-5 0 - 5 RBC/hpf   WBC, UA 0-5 0 - 5 WBC/hpf    Bacteria, UA NONE SEEN NONE SEEN   Squamous Epithelial / LPF 0-5 0 - 5   Mucus PRESENT    CT PELVIS W CONTRAST  Result Date: 06/12/2021 CLINICAL DATA:  Abscess, anal or rectal EXAM: CT PELVIS WITH CONTRAST TECHNIQUE: Multidetector CT imaging of the pelvis was performed using the standard protocol following the bolus administration of intravenous contrast. CONTRAST:  OMNIPAQUE IOHEXOL 300 MG/ML  SOLN COMPARISON:  None FINDINGS: Urinary Tract:  No abnormality visualized. Bowel: There is extensive inflammatory change involving the perianal soft tissues which appear expanded secondary in keeping with a perianal abscess. The fluid component appears multiloculated and circumferentially encompasses the anus from the 12 o'clock to 9 o'clock position. The fluid component is difficult to accurately measure because of its curvilinear configuration, however, this measures at least 3.7 x 2.2 x 4.5 cm on axial image # 136 and coronal reformat # 111. The inflammatory changes extend to the level of the levator ani, however, but do not appear to extend into the peritoneal cavity. The visualized bowel is unremarkable. No evidence of obstruction. No free intraperitoneal fluid within the pelvis. Vascular/Lymphatic: Mild atherosclerotic calcification within the iliac arteries. The abdominal vasculature is otherwise unremarkable. No pathologic adenopathy within the abdomen and pelvis. Reproductive: The prostate gland is unremarkable. Seminal vesicles are unremarkable. Other: Tiny bilateral fat containing inguinal hernias are present. Tiny umbilical fat containing hernia noted. Musculoskeletal: No acute bone abnormality. No lytic or blastic bone lesion. IMPRESSION: Extensive perianal inflammatory change with superimposed abscess surrounding the anus from the 12 o'clock to 9 o'clock position measuring at least 3.7 x 2.2 x 4.5 cm in greatest dimension. Inflammatory changes extend up to the level of the levator ani without  extending superiorly. Electronically Signed   By: Helyn Numbers MD   On: 06/12/2021 00:23      Fayrene Helper, PA-C 06/12/21 1217    Fayrene Helper, PA-C 06/12/21 1352    Gerhard Munch, MD 06/13/21 [phone]

## 2021-06-12 NOTE — ED Provider Notes (Signed)
[facility] EMERGENCY DEPARTMENT Provider Note   CSN: [id number] Arrival date & time: 06/11/21  2031     History No chief complaint on file.   Barry [name] is a 65 y.o. male with a history of hypertension, hyperlipidemia, and atrial fibrillation not on anticoagulation who presents to the emergency department who presents to the emergency department with a chief complaint of abscess.  The patient was seen at the [facility] walk-in clinic earlier today and was told that he had an abscess that appeared to be surgical and he needed to go to the emergency department.  The patient reports that he developed pain and swelling near the center of the rectum 5 days ago that has progressively spread and worsened since onset.  He endorses pain with defecation.  Pain is worse with sitting.  Pain is improved with laying on his side.  No other known aggravating or alleviating factors.  He reports chills and subjective fevers over the last day.  The history is provided by the patient and medical records. No language interpreter was used.      Past Medical History:  Diagnosis Date   Essential hypertension    H/O cardiovascular stress test    a. 12/2015 MV: EF 43% (nl by echo), small basal & mid inferoseptal defect - likely attenuation artifact.   Hyperlipidemia    Persistent atrial fibrillation (HCC)    a. Dx 12/2012; b. CHA2DS2VASc = 1-->Xarelto;  c. 12/2015 Echo: EF 50-55%, no rwma, mild AI, mildly dil Asc AO, triv MR/TR, sev dil LA, mild PI;  d. 02/2016 Successful DCCV w/ 510C; e. 04/2016 Recurrent AF.    Patient Active Problem List   Diagnosis Date Noted   Persistent atrial fibrillation [facility])    Essential hypertension 12/25/2015   Hyperlipidemia 12/25/2015   Atrial fibrillation (HCC) 12/25/2015    Past Surgical History:  Procedure Laterality Date   CARDIOVERSION N/A 02/18/2016   Procedure: CARDIOVERSION;  Surgeon: Vesta Mixer, MD;  Location: [facility] ENDOSCOPY;  Service:  Cardiovascular;  Laterality: N/A;   CARDIOVERSION N/A 06/22/2016   Procedure: CARDIOVERSION;  Surgeon: Vesta Mixer, MD;  Location: [facility] ENDOSCOPY;  Service: Cardiovascular;  Laterality: N/A;   HERNIA REPAIR  1982       Family History  Problem Relation Age of Onset   Hypertension Mother    Heart attack Mother    Hypertension Father    Hyperlipidemia Father    Heart attack Father     Social History   Tobacco Use   Smoking status: Never   Smokeless tobacco: Never  Substance Use Topics   Alcohol use: Yes    Alcohol/week: 2.0 standard drinks    Types: 2 Cans of beer per week   Drug use: No    Home Medications Prior to Admission medications   Medication Sig Start Date End Date Taking? Authorizing Provider  b complex vitamins capsule Take 1 capsule by mouth daily.   Yes [provider]  Cholecalciferol (VITAMIN D3) 125 MCG (5000 UT) TABS Take 1 tablet by mouth daily.   Yes [provider]  flecainide (TAMBOCOR) 100 MG tablet TAKE 1 TABLET BY MOUTH TWICE A DAY Patient taking differently: Take 100 mg by mouth 2 (two) times daily. 11/19/20  Yes Camnitz, Andree Coss, MD  ibuprofen (ADVIL,MOTRIN) 200 MG tablet Take 400-600 mg by mouth every 6 (six) hours as needed for moderate pain or headache.   Yes [provider]  metoprolol tartrate (LOPRESSOR) 25 MG tablet TAKE  1 TABLET BY MOUTH TWICE A DAY Patient taking differently: Take 25 mg by mouth 2 (two) times daily. 11/19/20  Yes Camnitz, Will Daphine DeutscherMartin, MD  naproxen sodium (ALEVE) 220 MG tablet Take 440 mg by mouth 2 (two) times daily as needed (pain).   Yes [provider]  rosuvastatin (CRESTOR) 20 MG tablet TAKE 1 TABLET BY MOUTH EVERY DAY Patient taking differently: Take 20 mg by mouth daily. 11/19/20  Yes Camnitz, Andree CossWill Martin, MD    Allergies    Patient has no known allergies.  Review of Systems   Review of Systems  Constitutional:  Positive for chills and fever. Negative for appetite change.   HENT:  Negative for congestion and sore throat.   Respiratory:  Negative for shortness of breath.   Cardiovascular:  Negative for chest pain.  Gastrointestinal:  Positive for rectal pain. Negative for abdominal pain, blood in stool, constipation, diarrhea and vomiting.  Genitourinary:  Positive for difficulty urinating. Negative for dysuria, flank pain, frequency, hematuria and urgency.  Musculoskeletal:  Positive for myalgias. Negative for arthralgias and back pain.  Skin:  Negative for rash.  Allergic/Immunologic: Negative for immunocompromised state.  Neurological:  Negative for seizures, syncope, weakness, numbness and headaches.  Psychiatric/Behavioral:  Negative for confusion.    Physical Exam Updated Vital Signs BP 140/72 (BP Location: Left Arm)   Pulse 88   Temp 98.6 F (37 C) (Oral)   Resp 18   Ht 6\' 1"  (1.854 m)   Wt 92 kg   SpO2 99%   BMI 26.76 kg/m   Physical Exam Vitals and nursing note reviewed.  Constitutional:      General: He is not in acute distress.    Appearance: He is well-developed. He is not ill-appearing, toxic-appearing or diaphoretic.  HENT:     Head: Normocephalic.  Eyes:     Conjunctiva/sclera: Conjunctivae normal.  Cardiovascular:     Rate and Rhythm: Normal rate and regular rhythm.     Heart sounds: No murmur heard. Pulmonary:     Effort: Pulmonary effort is normal.  Abdominal:     General: There is no distension.     Palpations: Abdomen is soft. There is no mass.     Tenderness: There is abdominal tenderness. There is no right CVA tenderness, left CVA tenderness, guarding or rebound.     Hernia: No hernia is present.  Genitourinary:    Comments: Chaperoned exam.  Significant fluctuance and induration to the perianal region.  Minimal serous drainage.  No obvious purulent drainage.  Significant pain with minimal palpation to the region. Musculoskeletal:     Cervical back: Neck supple.  Skin:    General: Skin is warm and dry.   Neurological:     Mental Status: He is alert.  Psychiatric:        Behavior: Behavior normal.    ED Results / Procedures / Treatments   Labs (all labs ordered are listed, but only abnormal results are displayed) Labs Reviewed  CBC WITH DIFFERENTIAL/PLATELET - Abnormal; Notable for the following components:      Result Value   WBC 15.5 (*)    RBC 4.13 (*)    Hemoglobin 12.7 (*)    HCT 37.3 (*)    Neutro Abs 13.5 (*)    Abs Immature Granulocytes 0.09 (*)    All other components within normal limits  BASIC METABOLIC PANEL - Abnormal; Notable for the following components:   Sodium 132 (*)    Chloride 97 (*)  Glucose, Bld 188 (*)    All other components within normal limits  URINALYSIS, ROUTINE W REFLEX MICROSCOPIC - Abnormal; Notable for the following components:   APPearance HAZY (*)    Specific Gravity, Urine >1.046 (*)    Glucose, UA 50 (*)    Hgb urine dipstick SMALL (*)    Ketones, ur 20 (*)    Protein, ur 30 (*)    All other components within normal limits    EKG None  Radiology CT PELVIS W CONTRAST  Result Date: 06/12/2021 CLINICAL DATA:  Abscess, anal or rectal EXAM: CT PELVIS WITH CONTRAST TECHNIQUE: Multidetector CT imaging of the pelvis was performed using the standard protocol following the bolus administration of intravenous contrast. CONTRAST:  OMNIPAQUE IOHEXOL 300 MG/ML  SOLN COMPARISON:  None FINDINGS: Urinary Tract:  No abnormality visualized. Bowel: There is extensive inflammatory change involving the perianal soft tissues which appear expanded secondary in keeping with a perianal abscess. The fluid component appears multiloculated and circumferentially encompasses the anus from the 12 o'clock to 9 o'clock position. The fluid component is difficult to accurately measure because of its curvilinear configuration, however, this measures at least 3.7 x 2.2 x 4.5 cm on axial image # 136 and coronal reformat # 111. The inflammatory changes extend to the level  of the levator ani, however, but do not appear to extend into the peritoneal cavity. The visualized bowel is unremarkable. No evidence of obstruction. No free intraperitoneal fluid within the pelvis. Vascular/Lymphatic: Mild atherosclerotic calcification within the iliac arteries. The abdominal vasculature is otherwise unremarkable. No pathologic adenopathy within the abdomen and pelvis. Reproductive: The prostate gland is unremarkable. Seminal vesicles are unremarkable. Other: Tiny bilateral fat containing inguinal hernias are present. Tiny umbilical fat containing hernia noted. Musculoskeletal: No acute bone abnormality. No lytic or blastic bone lesion. IMPRESSION: Extensive perianal inflammatory change with superimposed abscess surrounding the anus from the 12 o'clock to 9 o'clock position measuring at least 3.7 x 2.2 x 4.5 cm in greatest dimension. Inflammatory changes extend up to the level of the levator ani without extending superiorly. Electronically Signed   By: Helyn Numbers MD   On: 06/12/2021 00:23    Procedures Procedures   Medications Ordered in ED Medications  lidocaine (XYLOCAINE) 1 % (with pres) injection 15 mL (has no administration in time range)  morphine 4 MG/ML injection 4 mg (4 mg Intravenous Given 06/11/21 2054)  ondansetron (ZOFRAN) injection 4 mg (4 mg Intravenous Given 06/11/21 2055)  iohexol (OMNIPAQUE) 300 MG/ML solution 100 mL (100 mLs Intravenous Contrast Given 06/12/21 0011)  fentaNYL (SUBLIMAZE) injection 100 mcg (100 mcg Intravenous Given 06/12/21 0640)    ED Course  I have reviewed the triage vital signs and the nursing notes.  Pertinent labs & imaging results that were available during my care of the patient were reviewed by me and considered in my medical decision making (see chart for details).    MDM Rules/Calculators/A&P                           65 year old male who presents the emergency department with perianal abscess after he was evaluated at the [facility]  walk-in clinic earlier in the day.  Symptoms began over the last 5 days.  He has had subjective fever and chills over the last 24 hours.  Vital signs are stable.  Labs and imaging have been reviewed and independently interpreted by me.  Leukocytosis of 15.  Mild hyponatremia.  UA with elevated specific gravity.  Not concerning for infection.  Patient has been given fentanyl for pain control.  General surgery has been consulted and will see and evaluate the patient in the ED.  Patient care transferred to PA Trans at the end of my shift to follow-up after patient has been seen by general surgery. Patient presentation, ED course, and plan of care discussed with review of all pertinent labs and imaging. Please see his/her note for further details regarding further ED course and disposition.   Final Clinical Impression(s) / ED Diagnoses Final diagnoses:  None    Rx / DC Orders ED Discharge Orders     None        Lilac Hoff A, PA-C 06/12/21 0818    Geoffery Lyons, MD 06/13/21 2259

## 2021-06-12 NOTE — Sepsis Progress Note (Signed)
Attempted to contact bedside RN before her shift change to verify that antibiotics were given before blood cultures, unable to make contact.

## 2021-06-12 NOTE — H&P (Signed)
History and Physical    Barry [name] RKY:[id number] DOB: [birth year] DOA: 06/11/2021  PCP: Lawerance Cruel, MD Consultants:  cardiology: Dr. Curt Bears Patient coming from:  Home - lives with his wife   Chief Complaint: perirectal abscess   HPI: LILBURN STRAW is a 65 y.o. male with medical history significant of HTN, HLD, atrial fibrillation not anticoagulated due to low stroke risk who presented to ED with rectal abscess that has progressively gotten worse. He states symptoms started on Monday with swelling that started at top of his anus and then extended down the side. He states it was red and continued to get worse and more painful. He went to a walk in clinic who sent him to hospital. No recorded fever, has had some chills. No N/V/D. No stomach pain. He has had occasional palpitations at home. No chest pain. Has not had a BM since Tuesday or Wednesday. He also had some troubles urinating, but after the incision and drainage it is back to normal. He has never had a hx of pilonidal cysts or any other abscesses.    ED Course: vitals: afebrile, bp: 149/87, HR: 85, RR: 18 and 95% on room air. Pertinent labs: 15.5, hgb: 12.7 and hct: 37.3. CT pelvis showed extensive perianal inflammatory change with superimposed abscess surrounding the anus from the 12 o'clock to 9 o'clock position measuring at least 3.7 x 2.2 x 4.5cm in greatest dimension. Generally surgery saw him in ER with I&D and recommended d/c home on augmentin. He then had his heart rate go into the 150s and was given his home medication. BP dropped down into 80 systolic. Given 2L of fluid without much response although heart rate back down. Called and asked to admit. Sepsis labs stared as well and he was started on vancomycin.   Review of Systems: As per HPI; otherwise review of systems reviewed and negative.   Ambulatory Status:  Ambulates without assistance   Past Medical History:  Diagnosis Date   Essential hypertension     H/O cardiovascular stress test    a. 12/2015 MV: EF 43% (nl by echo), small basal & mid inferoseptal defect - likely attenuation artifact.   Hyperlipidemia    Persistent atrial fibrillation ([place])    a. Dx 12/2012; b. CHA2DS2VASc = 1-->Xarelto;  c. 12/2015 Echo: EF 50-55%, no rwma, mild AI, mildly dil Asc AO, triv MR/TR, sev dil LA, mild PI;  d. 02/2016 Successful DCCV w/ 315V; e. 04/2016 Recurrent AF.    Past Surgical History:  Procedure Laterality Date   CARDIOVERSION N/A 02/18/2016   Procedure: CARDIOVERSION;  Surgeon: Thayer Headings, MD;  Location: [place];  Service: Cardiovascular;  Laterality: N/A;   CARDIOVERSION N/A 06/22/2016   Procedure: CARDIOVERSION;  Surgeon: Thayer Headings, MD;  Location: [facility] ENDOSCOPY;  Service: Cardiovascular;  Laterality: N/A;   HERNIA REPAIR  1982    Social History   Socioeconomic History   Marital status: Married    Spouse name: Not on file   Number of children: Not on file   Years of education: Not on file   Highest education level: Not on file  Occupational History   Not on file  Tobacco Use   Smoking status: Never   Smokeless tobacco: Never  Substance and Sexual Activity   Alcohol use: Yes    Alcohol/week: 2.0 standard drinks    Types: 2 Cans of beer per week   Drug use: No   Sexual activity: Not on file  Other Topics  Concern   Not on file  Social History Narrative   Not on file   Social Determinants of Health   Financial Resource Strain: Not on file  Food Insecurity: Not on file  Transportation Needs: Not on file  Physical Activity: Not on file  Stress: Not on file  Social Connections: Not on file  Intimate Partner Violence: Not on file    No Known Allergies  Family History  Problem Relation Age of Onset   Hypertension Mother    Heart attack Mother    Hypertension Father    Hyperlipidemia Father    Heart attack Father     Prior to Admission medications   Medication Sig Start Date End Date Taking? Authorizing Provider   amoxicillin-clavulanate (AUGMENTIN) 875-125 MG tablet Take 1 tablet by mouth 2 (two) times daily for 3 days. 06/12/21 06/15/21 Yes Winferd Humphrey, PA-C  b complex vitamins capsule Take 1 capsule by mouth daily.   Yes [provider]  Cholecalciferol (VITAMIN D3) 125 MCG (5000 UT) TABS Take 1 tablet by mouth daily.   Yes [provider]  flecainide (TAMBOCOR) 100 MG tablet TAKE 1 TABLET BY MOUTH TWICE A DAY Patient taking differently: Take 100 mg by mouth 2 (two) times daily. 11/19/20  Yes Camnitz, Ocie Doyne, MD  ibuprofen (ADVIL,MOTRIN) 200 MG tablet Take 400-600 mg by mouth every 6 (six) hours as needed for moderate pain or headache.   Yes [provider]  metoprolol tartrate (LOPRESSOR) 25 MG tablet TAKE 1 TABLET BY MOUTH TWICE A DAY Patient taking differently: Take 25 mg by mouth 2 (two) times daily. 11/19/20  Yes Camnitz, Will Hassell Done, MD  naproxen sodium (ALEVE) 220 MG tablet Take 440 mg by mouth 2 (two) times daily as needed (pain).   Yes [provider]  rosuvastatin (CRESTOR) 20 MG tablet TAKE 1 TABLET BY MOUTH EVERY DAY Patient taking differently: Take 20 mg by mouth daily. 11/19/20  Yes Constance Haw, MD    Physical Exam: Vitals:   06/12/21 1230 06/12/21 1300 06/12/21 1330 06/12/21 1400  BP: (!) 92/56 (!) 104/59 (!) 93/53 (!) 89/51  Pulse: 85 67 71 70  Resp:      Temp:      TempSrc:      SpO2: 95% 98% 96% 97%  Weight:      Height:         General:  Appears calm and comfortable and is in NAD Eyes:  PERRL, EOMI, normal lids, iris ENT:  grossly normal hearing, lips & tongue, mmm; appropriate dentition Neck:  no LAD, masses or thyromegaly; no carotid bruits Cardiovascular:  RRR, no m/r/g. No LE edema.  Respiratory:   CTA bilaterally with no wheezes/rales/rhonchi.  Normal respiratory effort. Abdomen:  soft, NT, ND, NABS Back:   normal alignment, no CVAT Skin:  no rash or induration seen on limited exam. GU: left sided intergluteal  cleft abscess that has had I&D with packing. Surrounding erythema and edema.  Musculoskeletal:  grossly normal tone BUE/BLE, good ROM, no bony abnormality Lower extremity:  No LE edema.  Limited foot exam with no ulcerations.  2+ distal pulses. Psychiatric:  grossly normal mood and affect, speech fluent and appropriate, AOx3 Neurologic:  CN 2-12 grossly intact, moves all extremities in coordinated fashion, sensation intact    Radiological Exams on Admission: Independently reviewed - see discussion in A/P where applicable  CT PELVIS W CONTRAST  Result Date: 06/12/2021 CLINICAL DATA:  Abscess, anal or rectal EXAM: CT PELVIS WITH  CONTRAST TECHNIQUE: Multidetector CT imaging of the pelvis was performed using the standard protocol following the bolus administration of intravenous contrast. CONTRAST:  172m OMNIPAQUE IOHEXOL 300 MG/ML  SOLN COMPARISON:  None FINDINGS: Urinary Tract:  No abnormality visualized. Bowel: There is extensive inflammatory change involving the perianal soft tissues which appear expanded secondary in keeping with a perianal abscess. The fluid component appears multiloculated and circumferentially encompasses the anus from the 12 o'clock to 9 o'clock position. The fluid component is difficult to accurately measure because of its curvilinear configuration, however, this measures at least 3.7 x 2.2 x 4.5 cm on axial image # 136 and coronal reformat # 111. The inflammatory changes extend to the level of the levator ani, however, but do not appear to extend into the peritoneal cavity. The visualized bowel is unremarkable. No evidence of obstruction. No free intraperitoneal fluid within the pelvis. Vascular/Lymphatic: Mild atherosclerotic calcification within the iliac arteries. The abdominal vasculature is otherwise unremarkable. No pathologic adenopathy within the abdomen and pelvis. Reproductive: The prostate gland is unremarkable. Seminal vesicles are unremarkable. Other: Tiny bilateral  fat containing inguinal hernias are present. Tiny umbilical fat containing hernia noted. Musculoskeletal: No acute bone abnormality. No lytic or blastic bone lesion. IMPRESSION: Extensive perianal inflammatory change with superimposed abscess surrounding the anus from the 12 o'clock to 9 o'clock position measuring at least 3.7 x 2.2 x 4.5 cm in greatest dimension. Inflammatory changes extend up to the level of the levator ani without extending superiorly. Electronically Signed   By: AFidela SalisburyMD   On: 06/12/2021 00:23   DG Chest Port 1 View  Result Date: 06/12/2021 CLINICAL DATA:  Sepsis EXAM: PORTABLE CHEST 1 VIEW COMPARISON:  Chest x-ray dated April 08, 2010 FINDINGS: Cardiac contours may be accentuated by AP technique, although there is likely mild cardiomegaly. Tortuosity of thoracic aorta, likely age related. Lungs are clear.  No pleural abnormalities. IMPRESSION: Lungs are clear. Electronically Signed   By: LYetta GlassmanMD   On: 06/12/2021 14:16     Labs on Admission: I have personally reviewed the available labs and imaging studies at the time of the admission.  Pertinent labs:  Wbc: 15.5 Sodium: 132 Glucose: 188 Lactic acid: 1.0  Assessment/Plan Principal Problem:   Sepsis likely secondary to perirectal abscess Criteria met with leukocytosis, hypotension and tachycardia. Started on vanc in ED Zosyn added for more coverage of rectal area.  Given sepsis fluids and continued IVF with LR Sepsis labs pending including cultures Surgery already consulted and seen in ED with I&D already performed. They will continue to follow.  Plan was to d/c home, but with hypotension despite IVF will admit with IV abx and f/u on cultures.     Perirectal abscess S/p I&D by surgery Zosyn/vanc while in patient Surgery will continue to follow along.  Checking a1c due to elevated sugars and abscess and glucose in urine.   Active Problems:   Atrial fibrillation (HCC) In NSR, continue tele and home  medication of flecainide.  Follows outpatient with Dr. CCurt Bears  No anti coagulated due to low stroke risk per office notes  Essential hypertension -due to hypotension in ER, holding home beta blocker    Hyperlipidemia  -continue crestor    Body mass index is 26.76 kg/m.     Level of care: Telemetry Medical DVT prophylaxis:  SCDs with possibility of surgery  Code Status:  Full - confirmed with patient Family Communication: None present;  Disposition Plan:  The patient is from: home  Anticipated d/c is to: home   Requires inpatient hospitalization due to sepsis criteria and need for IVF resuscitation, Iv antibiotics and is at significant risk of worsening, requires constant monitoring, assessment and MDM with specialists.  Consults called: surgery   Admission status:  observation    Orma Flaming MD [facility]   How to contact the [facility] Attending or Consulting provider [place] or covering provider during after hours [place], for this patient?  Check the care team in [facility] and look for a) attending/consulting [facility] provider listed and b) the [facility] team listed Log into [email or web address] and use [place]'s universal password to access. If you do not have the password, please contact the hospital operator. Locate the [facility] provider you are looking for under [facility] and page to a number that you can be directly reached. If you still have difficulty reaching the provider, please page the [facility] (Director on Call) for the Hospitalists listed on amion for assistance.   06/12/2021, 3:02 PM

## 2021-06-12 NOTE — ED Notes (Addendum)
Called pt to be roomed, no response.  

## 2021-06-13 ENCOUNTER — Encounter (HOSPITAL_COMMUNITY): Payer: Self-pay | Admitting: Family Medicine

## 2021-06-13 DIAGNOSIS — Z8249 Family history of ischemic heart disease and other diseases of the circulatory system: Secondary | ICD-10-CM | POA: Diagnosis not present

## 2021-06-13 DIAGNOSIS — I4891 Unspecified atrial fibrillation: Secondary | ICD-10-CM

## 2021-06-13 DIAGNOSIS — Z83438 Family history of other disorder of lipoprotein metabolism and other lipidemia: Secondary | ICD-10-CM | POA: Diagnosis not present

## 2021-06-13 DIAGNOSIS — I10 Essential (primary) hypertension: Secondary | ICD-10-CM | POA: Diagnosis not present

## 2021-06-13 DIAGNOSIS — K612 Anorectal abscess: Secondary | ICD-10-CM | POA: Diagnosis present

## 2021-06-13 DIAGNOSIS — K5900 Constipation, unspecified: Secondary | ICD-10-CM | POA: Diagnosis present

## 2021-06-13 DIAGNOSIS — E871 Hypo-osmolality and hyponatremia: Secondary | ICD-10-CM | POA: Diagnosis present

## 2021-06-13 DIAGNOSIS — Z20822 Contact with and (suspected) exposure to covid-19: Secondary | ICD-10-CM | POA: Diagnosis present

## 2021-06-13 DIAGNOSIS — I959 Hypotension, unspecified: Secondary | ICD-10-CM | POA: Diagnosis present

## 2021-06-13 DIAGNOSIS — Z79899 Other long term (current) drug therapy: Secondary | ICD-10-CM | POA: Diagnosis not present

## 2021-06-13 DIAGNOSIS — R000 Tachycardia, unspecified: Secondary | ICD-10-CM | POA: Diagnosis not present

## 2021-06-13 DIAGNOSIS — K611 Rectal abscess: Secondary | ICD-10-CM

## 2021-06-13 DIAGNOSIS — A419 Sepsis, unspecified organism: Secondary | ICD-10-CM | POA: Diagnosis not present

## 2021-06-13 DIAGNOSIS — I4819 Other persistent atrial fibrillation: Secondary | ICD-10-CM | POA: Diagnosis present

## 2021-06-13 DIAGNOSIS — E785 Hyperlipidemia, unspecified: Secondary | ICD-10-CM | POA: Diagnosis present

## 2021-06-13 LAB — CBC
HCT: 31.8 % — ABNORMAL LOW (ref 39.0–52.0)
Hemoglobin: 11.3 g/dL — ABNORMAL LOW (ref 13.0–17.0)
MCH: 31 pg (ref 26.0–34.0)
MCHC: 35.5 g/dL (ref 30.0–36.0)
MCV: 87.4 fL (ref 80.0–100.0)
Platelets: 152 10*3/uL (ref 150–400)
RBC: 3.64 MIL/uL — ABNORMAL LOW (ref 4.22–5.81)
RDW: 11.7 % (ref 11.5–15.5)
WBC: 12.3 10*3/uL — ABNORMAL HIGH (ref 4.0–10.5)
nRBC: 0 % (ref 0.0–0.2)

## 2021-06-13 LAB — BASIC METABOLIC PANEL
Anion gap: 6 (ref 5–15)
BUN: 16 mg/dL (ref 8–23)
CO2: 26 mmol/L (ref 22–32)
Calcium: 8.4 mg/dL — ABNORMAL LOW (ref 8.9–10.3)
Chloride: 102 mmol/L (ref 98–111)
Creatinine, Ser: 0.83 mg/dL (ref 0.61–1.24)
GFR, Estimated: >60 mL/min (ref >60)
Glucose, Bld: 136 mg/dL — ABNORMAL HIGH (ref 70–99)
Potassium: 3.6 mmol/L (ref 3.5–5.1)
Sodium: 134 mmol/L — ABNORMAL LOW (ref 135–145)

## 2021-06-13 LAB — HIV ANTIBODY (ROUTINE TESTING W REFLEX): HIV Screen 4th Generation wRfx: NONREACTIVE

## 2021-06-13 LAB — MRSA NEXT GEN BY PCR, NASAL: MRSA by PCR Next Gen: NOT DETECTED

## 2021-06-13 MED ORDER — ENOXAPARIN SODIUM 40 MG/0.4ML IJ SOSY
40.0000 mg | PREFILLED_SYRINGE | INTRAMUSCULAR | Status: DC
  Administered 2021-06-13: 40 mg via SUBCUTANEOUS
  Filled 2021-06-13: qty 0.4

## 2021-06-13 MED ORDER — ROSUVASTATIN CALCIUM 20 MG PO TABS
20.0000 mg | ORAL_TABLET | Freq: Every day | ORAL | Status: DC
  Administered 2021-06-13 – 2021-06-14 (×2): 20 mg via ORAL
  Filled 2021-06-13 (×2): qty 1

## 2021-06-13 MED ORDER — FLECAINIDE ACETATE 100 MG PO TABS
100.0000 mg | ORAL_TABLET | Freq: Two times a day (BID) | ORAL | Status: DC
  Administered 2021-06-13 – 2021-06-14 (×3): 100 mg via ORAL
  Filled 2021-06-13 (×5): qty 1

## 2021-06-13 MED ORDER — VITAMIN D 25 MCG (1000 UNIT) PO TABS
5000.0000 [IU] | ORAL_TABLET | Freq: Every day | ORAL | Status: DC
  Administered 2021-06-13 – 2021-06-14 (×2): 5000 [IU] via ORAL
  Filled 2021-06-13 (×2): qty 5

## 2021-06-13 MED ORDER — METOPROLOL TARTRATE 12.5 MG HALF TABLET
12.5000 mg | ORAL_TABLET | Freq: Two times a day (BID) | ORAL | Status: DC
  Administered 2021-06-13 – 2021-06-14 (×3): 12.5 mg via ORAL
  Filled 2021-06-13 (×3): qty 1

## 2021-06-13 MED ORDER — SODIUM CHLORIDE 0.9 % IV SOLN: INTRAVENOUS | Status: DC

## 2021-06-13 MED ORDER — B COMPLEX VITAMINS PO CAPS: 1.0000 | ORAL_CAPSULE | Freq: Every day | ORAL | Status: DC

## 2021-06-13 MED ORDER — VITAMIN D3 125 MCG (5000 UT) PO TABS: 1.0000 | ORAL_TABLET | Freq: Every day | ORAL | Status: DC

## 2021-06-13 MED ORDER — B COMPLEX-C PO TABS
1.0000 | ORAL_TABLET | Freq: Every day | ORAL | Status: DC
  Administered 2021-06-13 – 2021-06-14 (×2): 1 via ORAL
  Filled 2021-06-13 (×2): qty 1

## 2021-06-13 NOTE — Progress Notes (Signed)
[facility] PROGRESS NOTE   EVERET [name] CWC:[id number] DOB: 06-28-1956 DOA: 06/11/2021  PCP: Barry Floro, MD  Brief History/Interval Summary: 65 year old Caucasian male with past medical history of essential hypertension, hyperlipidemia, atrial fibrillation not on anticoagulation presented with pain in his rectal area.  He was found to have rectal abscess.  Underwent I&D in the emergency department.  Plan was to discharge home but the patient became tachycardic and hypotensive.  Was subsequently hospitalized.   Consultants: General surgery  Procedures: I&D for perirectal abscess  Antibiotics: Anti-infectives (From admission, onward)    Start     Dose/Rate Route Frequency Ordered Stop   06/13/21 0400  vancomycin (VANCOREADY) IVPB 1250 mg/250 mL        1,250 mg 166.7 mL/hr over 90 Minutes Intravenous Every 12 hours 06/12/21 1400     06/12/21 1530  piperacillin-tazobactam (ZOSYN) IVPB 3.375 g        3.375 g 12.5 mL/hr over 240 Minutes Intravenous Every 8 hours 06/12/21 1517     06/12/21 1400  vancomycin (VANCOCIN) IVPB 1000 mg/200 mL premix  Status:  Discontinued        1,000 mg 200 mL/hr over 60 Minutes Intravenous  Once 06/12/21 1327 06/12/21 1347   06/12/21 1400  vancomycin (VANCOREADY) IVPB 2000 mg/400 mL        2,000 mg 200 mL/hr over 120 Minutes Intravenous  Once 06/12/21 1347 06/12/21 1723   06/12/21 1330  cefTRIAXone (ROCEPHIN) 2 g in sodium chloride 0.9 % 100 mL IVPB        2 g 200 mL/hr over 30 Minutes Intravenous  Once 06/12/21 1327 06/12/21 1503   06/12/21 0000  amoxicillin-clavulanate (AUGMENTIN) 875-125 MG tablet        1 tablet Oral 2 times daily 06/12/21 0932 06/15/21 2359       Subjective/Interval History: Patient continues to have pain and discomfort in the rectal area.  Denies any abdominal pain nausea or vomiting.  He has not been up and about yet.  Denies any shortness of breath.  No  lightheadedness.     Assessment/Plan:  Perirectal abscess with sepsis, present on admission Patient had leukocytosis hypotension and tachycardia.  However lactic acid level was normal.  Initially plan was to send him home with oral antibiotics however due to drop in blood pressure he was hospitalized.  He is currently on vancomycin and Zosyn.  Follow-up on cultures.  General surgery has reevaluated today and they still see quite a bit of inflammation in drainage from that site.  They may want to do another incision and drainage procedure in the morning.  Pain is adequately well controlled. Blood pressure and heart rate have stabilized.  WBC slightly better today.  History of atrial fibrillation Follows with Barry [name].  Takes flecainide and metoprolol at home.  Will resume flecainide.  Since blood pressure has stabilized the metoprolol could also be resumed. Patient not on anticoagulation due to low stroke risk per office notes.  Essential hypertension/mild hyponatremia See above.  Blood pressure has stabilized.  Continue to monitor.  Hyperlipidemia Crestor.    DVT Prophylaxis: Initiate Lovenox Code Status: Full code Family Communication: Discussed with the patient Disposition Plan: Hopefully home when improved  Status is: Observation  The patient will require care spanning > 2 midnights and should be moved to inpatient because: Ongoing diagnostic testing needed not appropriate for outpatient work up and IV treatments appropriate due to intensity of illness or inability to take PO  Dispo: The patient  is from: Home              Anticipated d/c is to: Home              Patient currently is not medically stable to d/c.   Difficult to place patient No       Medications: Scheduled: Continuous:  piperacillin-tazobactam (ZOSYN)  IV 3.375 g (06/13/21 0843)   vancomycin 1,250 mg (06/13/21 0428)   WLN:LGXQJJHERDEYC **OR** acetaminophen,  HYDROcodone-acetaminophen   Objective:  Vital Signs  Vitals:   06/12/21 1915 06/12/21 2128 06/13/21 0344 06/13/21 1222  BP: 117/75 (!) 150/81 118/73 118/61  Pulse: 91 75 64 73  Resp: (!) 23 20 17 16   Temp:  98.5 F (36.9 C)  97.9 F (36.6 C)  TempSrc:  Oral Oral Oral  SpO2: 95% 96% 97% 96%  Weight:  86.6 kg    Height:  6\' 1"  (1.854 m)      Intake/Output Summary (Last 24 hours) at 06/13/2021 1309 Last data filed at 06/13/2021 1204 Gross per 24 hour  Intake 110 ml  Output 950 ml  Net -840 ml   Filed Weights   06/11/21 2040 06/12/21 2128  Weight: 92 kg 86.6 kg    General appearance: Awake alert.  In no distress Resp: Clear to auscultation bilaterally.  Normal effort Cardio: S1-S2 is normal regular.  No S3-S4.  No rubs murmurs or bruit GI: Abdomen is soft.  Nontender nondistended.  Bowel sounds are present normal.  No masses organomegaly Extremities: No edema.  Full range of motion of lower extremities. Neurologic: Alert and oriented x3.  No focal neurological deficits.    Lab Results:  Data Reviewed: I have personally reviewed following labs and imaging studies  CBC: Recent Labs  Lab 06/11/21 2049 06/13/21 0048  WBC 15.5* 12.3*  NEUTROABS 13.5*  --   HGB 12.7* 11.3*  HCT 37.3* 31.8*  MCV 90.3 87.4  PLT 177 152    Basic Metabolic Panel: Recent Labs  Lab 06/11/21 2049 06/13/21 0048  NA 132* 134*  K 3.6 3.6  CL 97* 102  CO2 27 26  GLUCOSE 188* 136*  BUN 17 16  CREATININE 1.07 0.83  CALCIUM 9.0 8.4*    GFR: Estimated Creatinine Clearance: 104.3 mL/min (by C-G formula based on SCr of 0.83 mg/dL).    Coagulation Profile: Recent Labs  Lab 06/12/21 1439  INR 1.4*      HbA1C: Recent Labs    06/12/21 2219  HGBA1C 5.6      Recent Results (from the past 240 hour(s))  Blood Culture (routine x 2)     Status: None (Preliminary result)   Collection Time: 06/12/21  2:39 PM   Specimen: BLOOD  Result Value Ref Range Status   Specimen  Description BLOOD SITE NOT SPECIFIED  Final   Special Requests   Final    BOTTLES DRAWN AEROBIC AND ANAEROBIC Blood Culture adequate volume   Culture   Final    NO GROWTH < 24 HOURS Performed at [facility], [address], [place], [address] [place]    Report Status PENDING  Incomplete  Resp Panel by RT-PCR (Flu A&B, Covid) Nasopharyngeal Swab     Status: None   Collection Time: 06/12/21  2:47 PM   Specimen: Nasopharyngeal Swab; Nasopharyngeal(NP) swabs in vial transport medium  Result Value Ref Range Status   SARS Coronavirus 2 by RT PCR NEGATIVE NEGATIVE Final    Comment: (NOTE) SARS-CoV-2 target nucleic acids are NOT DETECTED.  The SARS-CoV-2 RNA is generally detectable in upper respiratory specimens during the acute phase of infection. The lowest concentration of SARS-CoV-2 viral copies this assay can detect is 138 copies/mL. A negative result does not preclude SARS-Cov-2 infection and should not be used as the sole basis for treatment or other patient management decisions. A negative result may occur with  improper specimen collection/handling, submission of specimen other than nasopharyngeal swab, presence of viral mutation(s) within the areas targeted by this assay, and inadequate number of viral copies(<138 copies/mL). A negative result must be combined with clinical observations, patient history, and epidemiological information. The expected result is Negative.  Fact Sheet for Patients:  BloggerCourse.comhttps://www.fda.gov/media/152166/download  Fact Sheet for Healthcare Providers:  SeriousBroker.ithttps://www.fda.gov/media/152162/download  This test is no t yet approved or cleared by the [place] FDA and  has been authorized for detection and/or diagnosis of SARS-CoV-2 by FDA under an Emergency Use Authorization (EUA). This EUA will remain  in effect (meaning this test can be used) for the duration of the COVID-19 declaration under Section 564(b)(1) of the Act, 21 U.S.C.section  360bbb-3(b)(1), unless the authorization is terminated  or revoked sooner.       Influenza A by PCR NEGATIVE NEGATIVE Final   Influenza B by PCR NEGATIVE NEGATIVE Final    Comment: (NOTE) The Xpert Xpress SARS-CoV-2/FLU/RSV plus assay is intended as an aid in the diagnosis of influenza from Nasopharyngeal swab specimens and should not be used as a sole basis for treatment. Nasal washings and aspirates are unacceptable for Xpert Xpress SARS-CoV-2/FLU/RSV testing.  Fact Sheet for Patients: BloggerCourse.comhttps://www.fda.gov/media/152166/download  Fact Sheet for Healthcare Providers: SeriousBroker.ithttps://www.fda.gov/media/152162/download  This test is not yet approved or cleared by the [place] FDA and has been authorized for detection and/or diagnosis of SARS-CoV-2 by FDA under an Emergency Use Authorization (EUA). This EUA will remain in effect (meaning this test can be used) for the duration of the COVID-19 declaration under Section 564(b)(1) of the Act, 21 U.S.C. section 360bbb-3(b)(1), unless the authorization is terminated or revoked.  Performed at [facility], [address] [place], [place] [address]   Blood Culture (routine x 2)     Status: None (Preliminary result)   Collection Time: 06/12/21 10:19 PM   Specimen: BLOOD  Result Value Ref Range Status   Specimen Description BLOOD LEFT ANTECUBITAL  Final   Special Requests   Final    BOTTLES DRAWN AEROBIC ONLY Blood Culture adequate volume   Culture   Final    NO GROWTH < 12 HOURS Performed at [facility], [address] [place], [place] [address]    Report Status PENDING  Incomplete      Radiology Studies: CT PELVIS W CONTRAST  Result Date: 06/12/2021 CLINICAL DATA:  Abscess, anal or rectal EXAM: CT PELVIS WITH CONTRAST TECHNIQUE: Multidetector CT imaging of the pelvis was performed using the standard protocol following the bolus administration of intravenous contrast. CONTRAST:  100mL OMNIPAQUE IOHEXOL 300 MG/ML  SOLN  COMPARISON:  None FINDINGS: Urinary Tract:  No abnormality visualized. Bowel: There is extensive inflammatory change involving the perianal soft tissues which appear expanded secondary in keeping with a perianal abscess. The fluid component appears multiloculated and circumferentially encompasses the anus from the 12 o'clock to 9 o'clock position. The fluid component is difficult to accurately measure because of its curvilinear configuration, however, this measures at least 3.7 x 2.2 x 4.5 cm on axial image # 136 and coronal reformat # 111. The inflammatory changes extend to the  level of the levator ani, however, but do not appear to extend into the peritoneal cavity. The visualized bowel is unremarkable. No evidence of obstruction. No free intraperitoneal fluid within the pelvis. Vascular/Lymphatic: Mild atherosclerotic calcification within the iliac arteries. The abdominal vasculature is otherwise unremarkable. No pathologic adenopathy within the abdomen and pelvis. Reproductive: The prostate gland is unremarkable. Seminal vesicles are unremarkable. Other: Tiny bilateral fat containing inguinal hernias are present. Tiny umbilical fat containing hernia noted. Musculoskeletal: No acute bone abnormality. No lytic or blastic bone lesion. IMPRESSION: Extensive perianal inflammatory change with superimposed abscess surrounding the anus from the 12 o'clock to 9 o'clock position measuring at least 3.7 x 2.2 x 4.5 cm in greatest dimension. Inflammatory changes extend up to the level of the levator ani without extending superiorly. Electronically Signed   By: Helyn Numbers MD   On: 06/12/2021 00:23   DG Chest Port 1 View  Result Date: 06/12/2021 CLINICAL DATA:  Sepsis EXAM: PORTABLE CHEST 1 VIEW COMPARISON:  Chest x-ray dated April 08, 2010 FINDINGS: Cardiac contours may be accentuated by AP technique, although there is likely mild cardiomegaly. Tortuosity of thoracic aorta, likely age related. Lungs are clear.  No  pleural abnormalities. IMPRESSION: Lungs are clear. Electronically Signed   By: Allegra Lai MD   On: 06/12/2021 14:16       LOS: 0 days   Osvaldo Shipper  [facility] Pager on [email or web address]  06/13/2021, 1:09 PM

## 2021-06-13 NOTE — Progress Notes (Signed)
Subjective: Patient still having quite a bit of pain, but otherwise feels ok.    ROS: See above, otherwise other systems negative  Objective: Vital signs in last 24 hours: Temp:  [98.5 F (36.9 C)] 98.5 F (36.9 C) (08/05 2128) Pulse Rate:  [64-107] 64 (08/06 0344) Resp:  [15-23] 17 (08/06 0344) BP: (89-150)/(51-81) 118/73 (08/06 0344) SpO2:  [95 %-98 %] 97 % (08/06 0344) Weight:  [86.6 kg] 86.6 kg (08/05 2128) Last BM Date: 06/09/21  Intake/Output from previous day: 08/05 0701 - 08/06 0700 In: 110 [P.O.:60; IV Piggyback:50] Out: 650 [Urine:650] Intake/Output this shift: Total I/O In: -  Out: 150 [Urine:150]  PE: Rectal: external hemorrhoids noted.  Still with some erythema and fluctuance noted.  Packing removed from wound and copious brown, purulent drainage.  This erythema extends anteriorly around his anus towards his perineum.    Lab Results:  Recent Labs    06/11/21 2049 06/13/21 0048  WBC 15.5* 12.3*  HGB 12.7* 11.3*  HCT 37.3* 31.8*  PLT 177 152   BMET Recent Labs    06/11/21 2049 06/13/21 0048  NA 132* 134*  K 3.6 3.6  CL 97* 102  CO2 27 26  GLUCOSE 188* 136*  BUN 17 16  CREATININE 1.07 0.83  CALCIUM 9.0 8.4*   PT/INR Recent Labs    06/12/21 1439  LABPROT 17.4*  INR 1.4*   CMP     Component Value Date/Time   NA 134 (L) 06/13/2021 0048   K 3.6 06/13/2021 0048   CL 102 06/13/2021 0048   CO2 26 06/13/2021 0048   GLUCOSE 136 (H) 06/13/2021 0048   BUN 16 06/13/2021 0048   CREATININE 0.83 06/13/2021 0048   CREATININE 0.91 06/21/2016 0733   CALCIUM 8.4 (L) 06/13/2021 0048   GFRNONAA >60 06/13/2021 0048   Lipase  No results found for: LIPASE     Studies/Results: CT PELVIS W CONTRAST  Result Date: 06/12/2021 CLINICAL DATA:  Abscess, anal or rectal EXAM: CT PELVIS WITH CONTRAST TECHNIQUE: Multidetector CT imaging of the pelvis was performed using the standard protocol following the bolus administration of intravenous contrast.  CONTRAST:  OMNIPAQUE IOHEXOL 300 MG/ML  SOLN COMPARISON:  None FINDINGS: Urinary Tract:  No abnormality visualized. Bowel: There is extensive inflammatory change involving the perianal soft tissues which appear expanded secondary in keeping with a perianal abscess. The fluid component appears multiloculated and circumferentially encompasses the anus from the 12 o'clock to 9 o'clock position. The fluid component is difficult to accurately measure because of its curvilinear configuration, however, this measures at least 3.7 x 2.2 x 4.5 cm on axial image # 136 and coronal reformat # 111. The inflammatory changes extend to the level of the levator ani, however, but do not appear to extend into the peritoneal cavity. The visualized bowel is unremarkable. No evidence of obstruction. No free intraperitoneal fluid within the pelvis. Vascular/Lymphatic: Mild atherosclerotic calcification within the iliac arteries. The abdominal vasculature is otherwise unremarkable. No pathologic adenopathy within the abdomen and pelvis. Reproductive: The prostate gland is unremarkable. Seminal vesicles are unremarkable. Other: Tiny bilateral fat containing inguinal hernias are present. Tiny umbilical fat containing hernia noted. Musculoskeletal: No acute bone abnormality. No lytic or blastic bone lesion. IMPRESSION: Extensive perianal inflammatory change with superimposed abscess surrounding the anus from the 12 o'clock to 9 o'clock position measuring at least 3.7 x 2.2 x 4.5 cm in greatest dimension. Inflammatory changes extend up to the level of the levator ani  without extending superiorly. Electronically Signed   By: Helyn Numbers MD   On: 06/12/2021 00:23   DG Chest Port 1 View  Result Date: 06/12/2021 CLINICAL DATA:  Sepsis EXAM: PORTABLE CHEST 1 VIEW COMPARISON:  Chest x-ray dated April 08, 2010 FINDINGS: Cardiac contours may be accentuated by AP technique, although there is likely mild cardiomegaly. Tortuosity of thoracic  aorta, likely age related. Lungs are clear.  No pleural abnormalities. IMPRESSION: Lungs are clear. Electronically Signed   By: Allegra Lai MD   On: 06/12/2021 14:16    Anti-infectives: Anti-infectives (From admission, onward)    Start     Dose/Rate Route Frequency Ordered Stop   06/13/21 0400  vancomycin (VANCOREADY) IVPB 1250 mg/250 mL        1,250 mg 166.7 mL/hr over 90 Minutes Intravenous Every 12 hours 06/12/21 1400     06/12/21 1530  piperacillin-tazobactam (ZOSYN) IVPB 3.375 g        3.375 g 12.5 mL/hr over 240 Minutes Intravenous Every 8 hours 06/12/21 1517     06/12/21 1400  vancomycin (VANCOCIN) IVPB 1000 mg/200 mL premix  Status:  Discontinued        1,000 mg 200 mL/hr over 60 Minutes Intravenous  Once 06/12/21 1327 06/12/21 1347   06/12/21 1400  vancomycin (VANCOREADY) IVPB 2000 mg/400 mL        2,000 mg 200 mL/hr over 120 Minutes Intravenous  Once 06/12/21 1347 06/12/21 1723   06/12/21 1330  cefTRIAXone (ROCEPHIN) 2 g in sodium chloride 0.9 % 100 mL IVPB        2 g 200 mL/hr over 30 Minutes Intravenous  Once 06/12/21 1327 06/12/21 1503   06/12/21 0000  amoxicillin-clavulanate (AUGMENTIN) 875-125 MG tablet        1 tablet Oral 2 times daily 06/12/21 0932 06/15/21 2359        Assessment/Plan Perirectal abscess -s/p bedside I&D.  Doesn't appear adequately drained currently.  Still with copious drainage -DC packing today and start Q 4hr sitz bathes and showers.  If this is still draining a significant amount tomorrow, likely needs to go to OR for wider incision and drainage -NPO p MN until evaluated tomorrow -cont abx therapy   FEN - regular, NPO p MN VTE - ok for chemical prophylaxis from our standpoint ID - [email or web address]   LOS: 0 days    Letha Cape , [facility] Surgery 06/13/2021, 11:49 AM Please see Amion for pager number during day hours 7:00am-4:30pm or 7:00am -11:30am on weekends

## 2021-06-14 DIAGNOSIS — K611 Rectal abscess: Secondary | ICD-10-CM | POA: Diagnosis not present

## 2021-06-14 LAB — CBC
HCT: 35.8 % — ABNORMAL LOW (ref 39.0–52.0)
Hemoglobin: 12.7 g/dL — ABNORMAL LOW (ref 13.0–17.0)
MCH: 31.1 pg (ref 26.0–34.0)
MCHC: 35.5 g/dL (ref 30.0–36.0)
MCV: 87.7 fL (ref 80.0–100.0)
Platelets: 195 10*3/uL (ref 150–400)
RBC: 4.08 MIL/uL — ABNORMAL LOW (ref 4.22–5.81)
RDW: 11.6 % (ref 11.5–15.5)
WBC: 10.9 10*3/uL — ABNORMAL HIGH (ref 4.0–10.5)
nRBC: 0 % (ref 0.0–0.2)

## 2021-06-14 LAB — BASIC METABOLIC PANEL
Anion gap: 9 (ref 5–15)
BUN: 13 mg/dL (ref 8–23)
CO2: 27 mmol/L (ref 22–32)
Calcium: 9.1 mg/dL (ref 8.9–10.3)
Chloride: 103 mmol/L (ref 98–111)
Creatinine, Ser: 0.9 mg/dL (ref 0.61–1.24)
GFR, Estimated: >60 mL/min (ref >60)
Glucose, Bld: 103 mg/dL — ABNORMAL HIGH (ref 70–99)
Potassium: 3.9 mmol/L (ref 3.5–5.1)
Sodium: 139 mmol/L (ref 135–145)

## 2021-06-14 MED ORDER — HYDROCODONE-ACETAMINOPHEN 5-325 MG PO TABS: 1.0000 | ORAL_TABLET | Freq: Four times a day (QID) | ORAL | 0 refills | Status: DC | PRN

## 2021-06-14 MED ORDER — AMOXICILLIN-POT CLAVULANATE 875-125 MG PO TABS: 1.0000 | ORAL_TABLET | Freq: Two times a day (BID) | ORAL | 0 refills | Status: AC

## 2021-06-14 NOTE — Progress Notes (Signed)
Subjective: Patient much better today.  Did well with sitz bathes and showers yesterday.  Much less drainage  ROS: See above, otherwise other systems negative  Objective: Vital signs in last 24 hours: Temp:  [97.9 F (36.6 C)-98.9 F (37.2 C)] 98.7 F (37.1 C) (08/07 0510) Pulse Rate:  [65-76] 65 (08/07 0510) Resp:  [16-18] 18 (08/07 0510) BP: (101-118)/(56-70) 118/60 (08/07 0510) SpO2:  [94 %-97 %] 94 % (08/07 0510) Last BM Date: 06/13/21  Intake/Output from previous day: 08/06 0701 - 08/07 0700 In: 896.5 [P.O.:480; I.V.:66.5; IV Piggyback:350] Out: 2500 [Urine:2500] Intake/Output this shift: No intake/output data recorded.  PE: Rectal: external hemorrhoids noted.  Erythema still present but much less, less induration, wound open still with minimal drainage.  Lab Results:  Recent Labs    06/13/21 0048 06/14/21 0048  WBC 12.3* 10.9*  HGB 11.3* 12.7*  HCT 31.8* 35.8*  PLT 152 195   BMET Recent Labs    06/13/21 0048 06/14/21 0048  NA 134* 139  K 3.6 3.9  CL 102 103  CO2 26 27  GLUCOSE 136* 103*  BUN 16 13  CREATININE 0.83 0.90  CALCIUM 8.4* 9.1   PT/INR Recent Labs    06/12/21 1439  LABPROT 17.4*  INR 1.4*   CMP     Component Value Date/Time   NA 139 06/14/2021 0048   K 3.9 06/14/2021 0048   CL 103 06/14/2021 0048   CO2 27 06/14/2021 0048   GLUCOSE 103 (H) 06/14/2021 0048   BUN 13 06/14/2021 0048   CREATININE 0.90 06/14/2021 0048   CREATININE 0.91 06/21/2016 0733   CALCIUM 9.1 06/14/2021 0048   GFRNONAA >60 06/14/2021 0048   Lipase  No results found for: LIPASE     Studies/Results: DG Chest Port 1 View  Result Date: 06/12/2021 CLINICAL DATA:  Sepsis EXAM: PORTABLE CHEST 1 VIEW COMPARISON:  Chest x-ray dated April 08, 2010 FINDINGS: Cardiac contours may be accentuated by AP technique, although there is likely mild cardiomegaly. Tortuosity of thoracic aorta, likely age related. Lungs are clear.  No pleural abnormalities. IMPRESSION:  Lungs are clear. Electronically Signed   By: Allegra Lai MD   On: 06/12/2021 14:16    Anti-infectives: Anti-infectives (From admission, onward)    Start     Dose/Rate Route Frequency Ordered Stop   06/14/21 0000  amoxicillin-clavulanate (AUGMENTIN) 875-125 MG tablet        1 tablet Oral 2 times daily 06/14/21 1005 06/19/21 2359   06/13/21 0400  vancomycin (VANCOREADY) IVPB 1250 mg/250 mL        1,250 mg 166.7 mL/hr over 90 Minutes Intravenous Every 12 hours 06/12/21 1400     06/12/21 1530  piperacillin-tazobactam (ZOSYN) IVPB 3.375 g        3.375 g 12.5 mL/hr over 240 Minutes Intravenous Every 8 hours 06/12/21 1517     06/12/21 1400  vancomycin (VANCOCIN) IVPB 1000 mg/200 mL premix  Status:  Discontinued        1,000 mg 200 mL/hr over 60 Minutes Intravenous  Once 06/12/21 1327 06/12/21 1347   06/12/21 1400  vancomycin (VANCOREADY) IVPB 2000 mg/400 mL        2,000 mg 200 mL/hr over 120 Minutes Intravenous  Once 06/12/21 1347 06/12/21 1723   06/12/21 1330  cefTRIAXone (ROCEPHIN) 2 g in sodium chloride 0.9 % 100 mL IVPB        2 g 200 mL/hr over 30 Minutes Intravenous  Once 06/12/21 1327 06/12/21 1503  06/12/21 0000  amoxicillin-clavulanate (AUGMENTIN) 875-125 MG tablet  Status:  Discontinued        1 tablet Oral 2 times daily 06/12/21 0932 06/14/21         Assessment/Plan Perirectal abscess -s/p bedside I&D.   -wound looks much better today.  Much more adequately drained.  Probed with no further loculations. -no further need for  I&D today  -stable for DC home.  D/w medical team.  Plan for Dc -complete 5-7 days of abx therapy, augmentin at home -has follow up in our office already scheduled  FEN - regular VTE - ok for chemical prophylaxis from our standpoint ID - [email or web address]   LOS: 1 day    Letha Cape , [facility] Surgery 06/14/2021, 10:17 AM Please see Amion for pager number during day hours 7:00am-4:30pm or 7:00am -11:30am on weekends

## 2021-06-14 NOTE — Discharge Summary (Signed)
[facility]  Physician Discharge Summary   Patient ID: Barry [name] MRN: [id number] DOB/AGE: [birth year] 65 y.o.  Admit date: 06/11/2021 Discharge date:   06/14/2021   PCP: Daisy Floro, MD  DISCHARGE DIAGNOSES:  Perirectal abscess status post I&D Paroxysmal atrial fibrillation Essential hypertension  RECOMMENDATIONS FOR OUTPATIENT FOLLOW UP: Outpatient follow-up with general surgery  Home Health: None Equipment/Devices: None  CODE STATUS: Full code  DISCHARGE CONDITION: fair  Diet recommendation: As before  INITIAL HISTORY:  65 year old Caucasian male with past medical history of essential hypertension, hyperlipidemia, atrial fibrillation not on anticoagulation presented with pain in his rectal area.  He was found to have rectal abscess.  Underwent I&D in the emergency department.  Plan was to discharge home but the patient became tachycardic and hypotensive.  Was subsequently hospitalized.   Consultants: General surgery   Procedures: I&D for perirectal abscess   HOSPITAL COURSE:   Perirectal abscess with sepsis, present on admission Patient had leukocytosis hypotension and tachycardia.  However lactic acid level was normal.  Initially plan was to send him home with oral antibiotics however due to drop in blood pressure he was hospitalized.  He was continued on vancomycin and Zosyn.  Seen by general surgery yesterday.  Sitz bath was recommended.  Initially they were concerned that they may have to take him to the OR to widen the incision.  Reevaluated this morning and they are satisfied with the way the wound looks.  They will follow the patient in their office in the next few days.  The recommend Augmentin for a total of 5 to 7 days.  Pain medications will be prescribed.  Patient has ambulated yesterday without difficulty.  Does not have any dizziness or lightheadedness.  WBC is noted to be better.  He is afebrile.     History of atrial  fibrillation Follows with Dr. Elberta Fortis.  Takes flecainide and metoprolol at home.  Patient not on anticoagulation due to low stroke risk per office notes.  Essential hypertension/mild hyponatremia Improved.  Hyperlipidemia Continue home medication.  Patient is stable.  Cleared by general surgery for discharge.  Okay for discharge today.  PERTINENT LABS:  The results of significant diagnostics from this hospitalization (including imaging, microbiology, ancillary and laboratory) are listed below for reference.    Microbiology: Recent Results (from the past 240 hour(s))  Blood Culture (routine x 2)     Status: None (Preliminary result)   Collection Time: 06/12/21  2:39 PM   Specimen: BLOOD  Result Value Ref Range Status   Specimen Description BLOOD SITE NOT SPECIFIED  Final   Special Requests   Final    BOTTLES DRAWN AEROBIC AND ANAEROBIC Blood Culture adequate volume   Culture   Final    NO GROWTH 2 DAYS Performed at [facility], [address], [place], [place] [address]    Report Status PENDING  Incomplete  Resp Panel by RT-PCR (Flu A&B, Covid) Nasopharyngeal Swab     Status: None   Collection Time: 06/12/21  2:47 PM   Specimen: Nasopharyngeal Swab; Nasopharyngeal(NP) swabs in vial transport medium  Result Value Ref Range Status   SARS Coronavirus 2 by RT PCR NEGATIVE NEGATIVE Final    Comment: (NOTE) SARS-CoV-2 target nucleic acids are NOT DETECTED.  The SARS-CoV-2 RNA is generally detectable in upper respiratory specimens during the acute phase of infection. The lowest concentration of SARS-CoV-2 viral copies this assay can detect is 138 copies/mL. A negative result does not preclude SARS-Cov-2 infection and  should not be used as the sole basis for treatment or other patient management decisions. A negative result may occur with  improper specimen collection/handling, submission of specimen other than nasopharyngeal swab, presence of viral mutation(s) within  the areas targeted by this assay, and inadequate number of viral copies(<138 copies/mL). A negative result must be combined with clinical observations, patient history, and epidemiological information. The expected result is Negative.  Fact Sheet for Patients:  BloggerCourse.comhttps://www.fda.gov/media/152166/download  Fact Sheet for Healthcare Providers:  SeriousBroker.ithttps://www.fda.gov/media/152162/download  This test is no t yet approved or cleared by the [place] FDA and  has been authorized for detection and/or diagnosis of SARS-CoV-2 by FDA under an Emergency Use Authorization (EUA). This EUA will remain  in effect (meaning this test can be used) for the duration of the COVID-19 declaration under Section 564(b)(1) of the Act, 21 U.S.C.section 360bbb-3(b)(1), unless the authorization is terminated  or revoked sooner.       Influenza A by PCR NEGATIVE NEGATIVE Final   Influenza B by PCR NEGATIVE NEGATIVE Final    Comment: (NOTE) The Xpert Xpress SARS-CoV-2/FLU/RSV plus assay is intended as an aid in the diagnosis of influenza from Nasopharyngeal swab specimens and should not be used as a sole basis for treatment. Nasal washings and aspirates are unacceptable for Xpert Xpress SARS-CoV-2/FLU/RSV testing.  Fact Sheet for Patients: BloggerCourse.comhttps://www.fda.gov/media/152166/download  Fact Sheet for Healthcare Providers: SeriousBroker.ithttps://www.fda.gov/media/152162/download  This test is not yet approved or cleared by the [place] FDA and has been authorized for detection and/or diagnosis of SARS-CoV-2 by FDA under an Emergency Use Authorization (EUA). This EUA will remain in effect (meaning this test can be used) for the duration of the COVID-19 declaration under Section 564(b)(1) of the Act, 21 U.S.C. section 360bbb-3(b)(1), unless the authorization is terminated or revoked.  Performed at [facility], [address] [place], [place] [address]   Blood Culture (routine x 2)     Status: None  (Preliminary result)   Collection Time: 06/12/21 10:19 PM   Specimen: BLOOD  Result Value Ref Range Status   Specimen Description BLOOD LEFT ANTECUBITAL  Final   Special Requests   Final    BOTTLES DRAWN AEROBIC ONLY Blood Culture adequate volume   Culture   Final    NO GROWTH 2 DAYS Performed at [facility], [address] [place], [place] [address]    Report Status PENDING  Incomplete  MRSA Next Gen by PCR, Nasal     Status: None   Collection Time: 06/13/21 11:22 AM   Specimen: Nasal Mucosa; Nasal Swab  Result Value Ref Range Status   MRSA by PCR Next Gen NOT DETECTED NOT DETECTED Final    Comment: (NOTE) The GeneXpert MRSA Assay (FDA approved for NASAL specimens only), is one component of a comprehensive MRSA colonization surveillance program. It is not intended to diagnose MRSA infection nor to guide or monitor treatment for MRSA infections. Test performance is not FDA approved in patients less than 63 years old. Performed at [facility], [address] [place], [place] [address]      Labs:    Basic Metabolic Panel: Recent Labs  Lab 06/11/21 2049 06/13/21 0048 06/14/21 0048  NA 132* 134* 139  K 3.6 3.6 3.9  CL 97* 102 103  CO2 27 26 27   GLUCOSE 188* 136* 103*  BUN 17 16 13   CREATININE 1.07 0.83 0.90  CALCIUM 9.0 8.4* 9.1    CBC: Recent Labs  Lab 06/11/21 2049  06/13/21 0048 06/14/21 0048  WBC 15.5* 12.3* 10.9*  NEUTROABS 13.5*  --   --   HGB 12.7* 11.3* 12.7*  HCT 37.3* 31.8* 35.8*  MCV 90.3 87.4 87.7  PLT 177 152 195      IMAGING STUDIES CT PELVIS W CONTRAST  Result Date: 06/12/2021 CLINICAL DATA:  Abscess, anal or rectal EXAM: CT PELVIS WITH CONTRAST TECHNIQUE: Multidetector CT imaging of the pelvis was performed using the standard protocol following the bolus administration of intravenous contrast. CONTRAST:  OMNIPAQUE IOHEXOL 300 MG/ML  SOLN COMPARISON:  None FINDINGS: Urinary Tract:  No abnormality visualized. Bowel:  There is extensive inflammatory change involving the perianal soft tissues which appear expanded secondary in keeping with a perianal abscess. The fluid component appears multiloculated and circumferentially encompasses the anus from the 12 o'clock to 9 o'clock position. The fluid component is difficult to accurately measure because of its curvilinear configuration, however, this measures at least 3.7 x 2.2 x 4.5 cm on axial image # 136 and coronal reformat # 111. The inflammatory changes extend to the level of the levator ani, however, but do not appear to extend into the peritoneal cavity. The visualized bowel is unremarkable. No evidence of obstruction. No free intraperitoneal fluid within the pelvis. Vascular/Lymphatic: Mild atherosclerotic calcification within the iliac arteries. The abdominal vasculature is otherwise unremarkable. No pathologic adenopathy within the abdomen and pelvis. Reproductive: The prostate gland is unremarkable. Seminal vesicles are unremarkable. Other: Tiny bilateral fat containing inguinal hernias are present. Tiny umbilical fat containing hernia noted. Musculoskeletal: No acute bone abnormality. No lytic or blastic bone lesion. IMPRESSION: Extensive perianal inflammatory change with superimposed abscess surrounding the anus from the 12 o'clock to 9 o'clock position measuring at least 3.7 x 2.2 x 4.5 cm in greatest dimension. Inflammatory changes extend up to the level of the levator ani without extending superiorly. Electronically Signed   By: Helyn Numbers MD   On: 06/12/2021 00:23   DG Chest Port 1 View  Result Date: 06/12/2021 CLINICAL DATA:  Sepsis EXAM: PORTABLE CHEST 1 VIEW COMPARISON:  Chest x-ray dated April 08, 2010 FINDINGS: Cardiac contours may be accentuated by AP technique, although there is likely mild cardiomegaly. Tortuosity of thoracic aorta, likely age related. Lungs are clear.  No pleural abnormalities. IMPRESSION: Lungs are clear. Electronically Signed   By: Allegra Lai MD   On: 06/12/2021 14:16    DISCHARGE EXAMINATION: Vitals:   06/13/21 1222 06/13/21 1830 06/13/21 2321 06/14/21 0510  BP: 118/61 117/70 (!) 101/56 118/60  Pulse: 73 76 68 65  Resp: Temp: 97.9 F (36.6 C) 98.9 F (37.2 C) 98.8 F (37.1 C) 98.7 F (37.1 C)  TempSrc: Oral Oral Oral Oral  SpO2: 96% 95% 97% 94%  Weight:      Height:       General appearance: Awake alert.  In no distress Resp: Clear to auscultation bilaterally.  Normal effort Cardio: S1-S2 is normal regular.  No S3-S4.  No rubs murmurs or bruit GI: Abdomen is soft.  Nontender nondistended.  Bowel sounds are present normal.  No masses organomegaly    DISPOSITION: Home  Discharge Instructions     Call MD for:  difficulty breathing, headache or visual disturbances   Complete by: As directed    Call MD for:  extreme fatigue   Complete by: As directed    Call MD for:  hives   Complete by: As directed    Call MD for:  persistant  dizziness or light-headedness   Complete by: As directed    Call MD for:  persistant nausea and vomiting   Complete by: As directed    Call MD for:  redness, tenderness, or signs of infection (pain, swelling, redness, odor or green/yellow discharge around incision site)   Complete by: As directed    Call MD for:  severe uncontrolled pain   Complete by: As directed    Call MD for:  temperature >100.4   Complete by: As directed    Diet - low sodium heart healthy   Complete by: As directed    Discharge instructions   Complete by: As directed    Please take your medications as prescribed.  Follow instructions given by the surgeon.  Follow-up with them in their office as instructed.  Seek attention if you develop high fevers or have worsening pain in the rectal area.  You were cared for by a hospitalist during your hospital stay. If you have any questions about your discharge medications or the care you received while you were in the hospital after you are  discharged, you can call the unit and asked to speak with the hospitalist on call if the hospitalist that took care of you is not available. Once you are discharged, your primary care physician will handle any further medical issues. Please note that NO REFILLS for any discharge medications will be authorized once you are discharged, as it is imperative that you return to your primary care physician (or establish a relationship with a primary care physician if you do not have one) for your aftercare needs so that they can reassess your need for medications and monitor your lab values. If you do not have a primary care physician, you can call [phone] for a physician referral.   Increase activity slowly   Complete by: As directed           Allergies as of 06/14/2021   No Known Allergies      Medication List     TAKE these medications    amoxicillin-clavulanate 875-125 MG tablet Commonly known as: Augmentin Take 1 tablet by mouth 2 (two) times daily for 5 days.   b complex vitamins capsule Take 1 capsule by mouth daily.   flecainide 100 MG tablet Commonly known as: TAMBOCOR TAKE 1 TABLET BY MOUTH TWICE A DAY   HYDROcodone-acetaminophen 5-325 MG tablet Commonly known as: NORCO/VICODIN Take 1 tablet by mouth every 6 (six) hours as needed for moderate pain.   ibuprofen 200 MG tablet Commonly known as: ADVIL Take 400-600 mg by mouth every 6 (six) hours as needed for moderate pain or headache.   metoprolol tartrate 25 MG tablet Commonly known as: LOPRESSOR TAKE 1 TABLET BY MOUTH TWICE A DAY   naproxen sodium 220 MG tablet Commonly known as: ALEVE Take 440 mg by mouth 2 (two) times daily as needed (pain).   rosuvastatin 20 MG tablet Commonly known as: CRESTOR TAKE 1 TABLET BY MOUTH EVERY DAY   Vitamin D3 125 MCG (5000 UT) Tabs Take 1 tablet by mouth daily.          Follow-up Information     [facility] Follow up.   Specialty: General Surgery Why:  follow up in clinic on 8/11 at 3:00 pm. Please arrive 30 minutes prior to your appointment time to complete check in process and bring photo ID and insurance card Contact information: [address] [place] [address] [phone]  TOTAL DISCHARGE TIME: 35 minutes  Itzia Cunliffe [employer] on [email or web address]  06/14/2021, 12:15 PM

## 2021-06-14 NOTE — Progress Notes (Signed)
Patient has ordered for discharge. Given discharge instructions with paper to the patient and family. Iv removed. Given all belongings to the patient. 

## 2021-06-15 LAB — URINE CULTURE: Culture: NO GROWTH

## 2021-06-17 LAB — CULTURE, BLOOD (ROUTINE X 2)
Culture: NO GROWTH
Culture: NO GROWTH
Special Requests: ADEQUATE
Special Requests: ADEQUATE

## 2021-11-12 ENCOUNTER — Other Ambulatory Visit: Payer: Self-pay | Admitting: Cardiology

## 2021-12-11 ENCOUNTER — Other Ambulatory Visit: Payer: Self-pay | Admitting: Cardiology

## 2022-01-08 ENCOUNTER — Other Ambulatory Visit: Payer: Self-pay | Admitting: Cardiology

## 2022-01-13 IMAGING — CT CT PELVIS W/ CM
2 of 3 series · 16 of 46 positions shown, 18 images · IV contrast (APPLIED)
Comparison: None

CLINICAL DATA: Abscess, anal or rectal

EXAM:
CT PELVIS WITH CONTRAST
TECHNIQUE: Multidetector CT imaging of the pelvis was performed using the
standard protocol following the bolus administration of intravenous
contrast.
CONTRAST:  100mL OMNIPAQUE IOHEXOL 300 MG/ML  SOLN

[Series 4: soft tissue · axial · 0.79mm/px · z∈[-651,-349]mm · 13 of 175 slices shown, 15 images]
[im 12/175  soft-tissue]
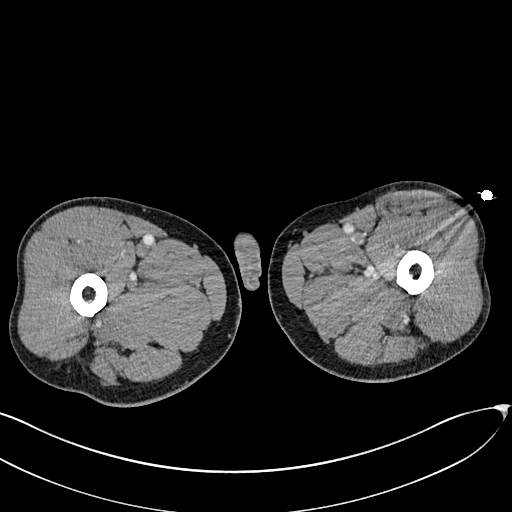
[im 12/175  bone]
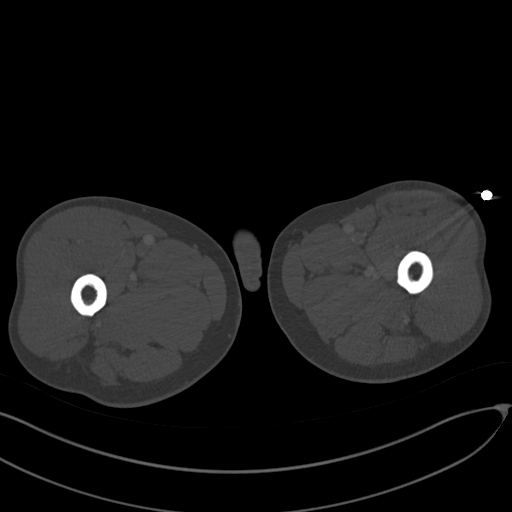
[im 23/175  soft-tissue]
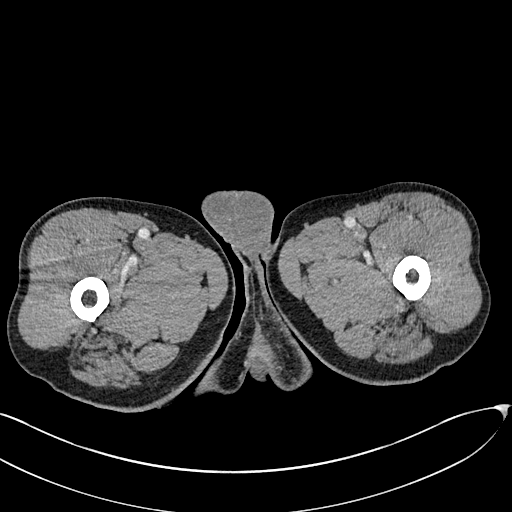
[im 34/175  soft-tissue]
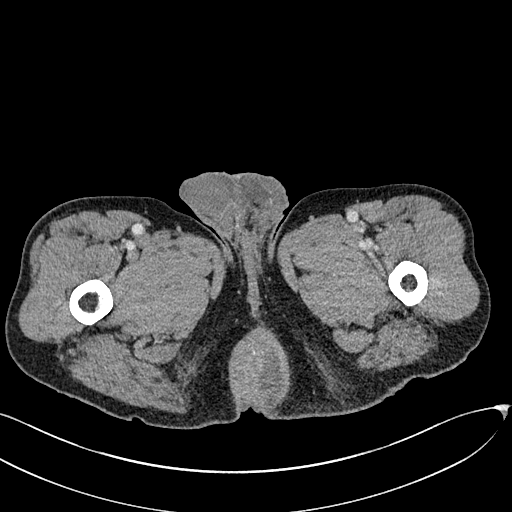
[im 51/175  soft-tissue]
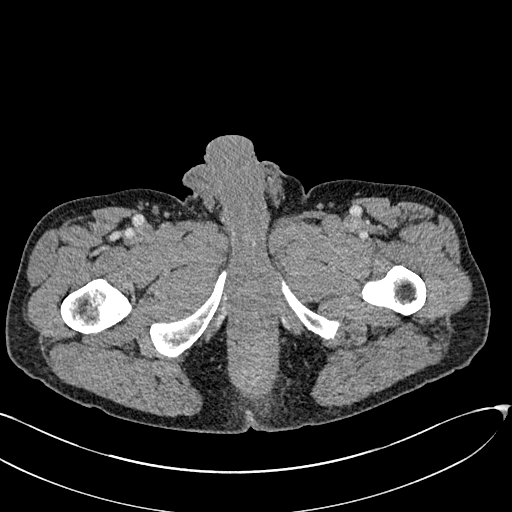
[im 62/175  soft-tissue]
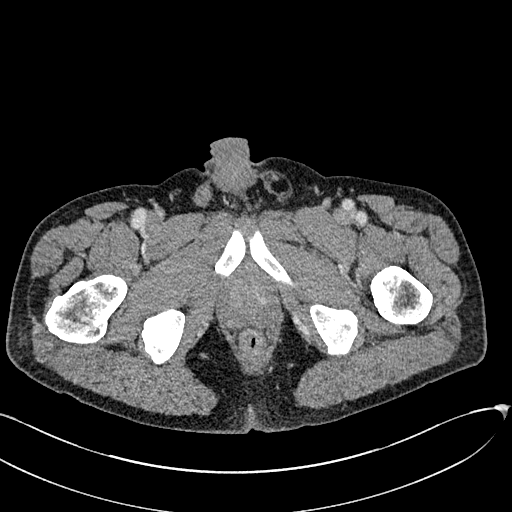
[im 73/175  soft-tissue]
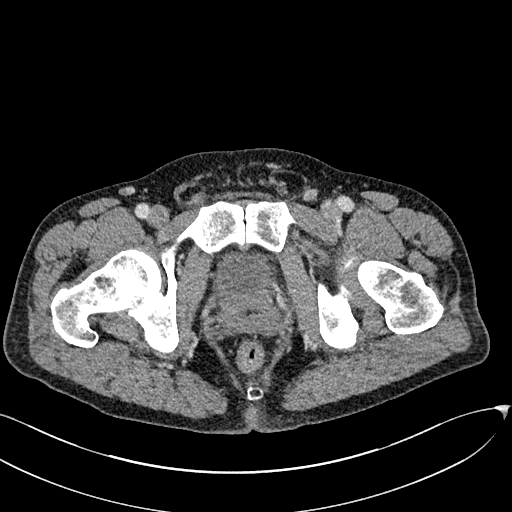
[im 90/175  soft-tissue]
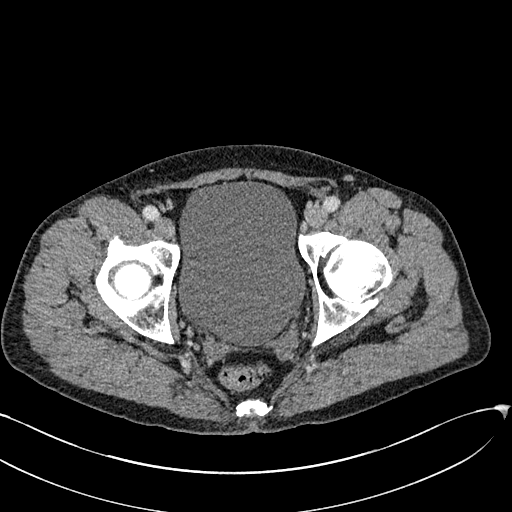
[im 102/175  soft-tissue]
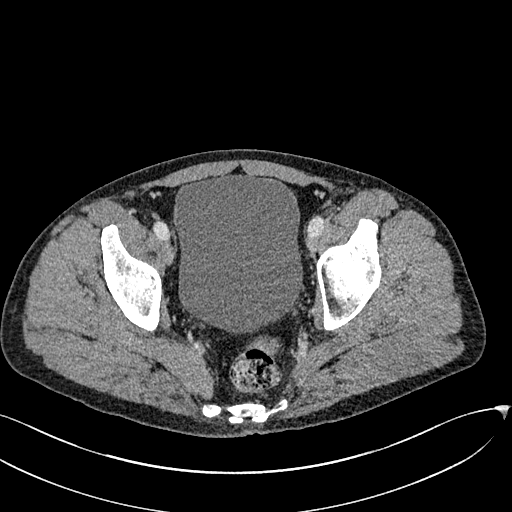
[im 113/175  soft-tissue]
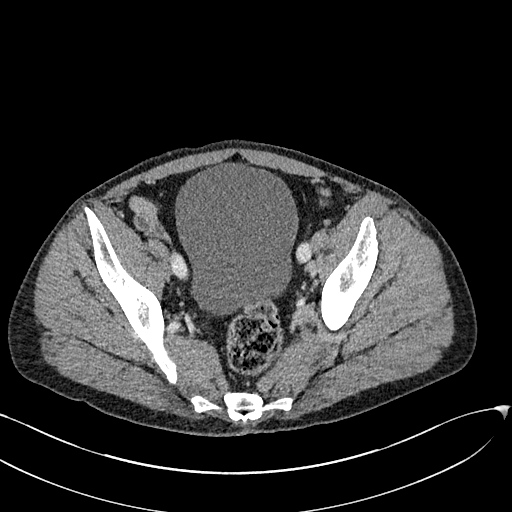
[im 113/175  bone]
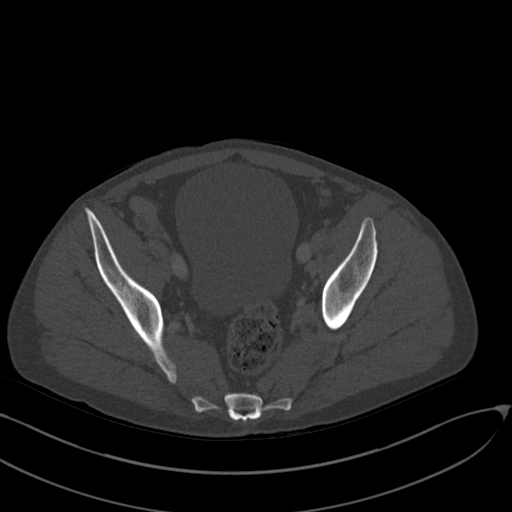
[im 124/175  soft-tissue]
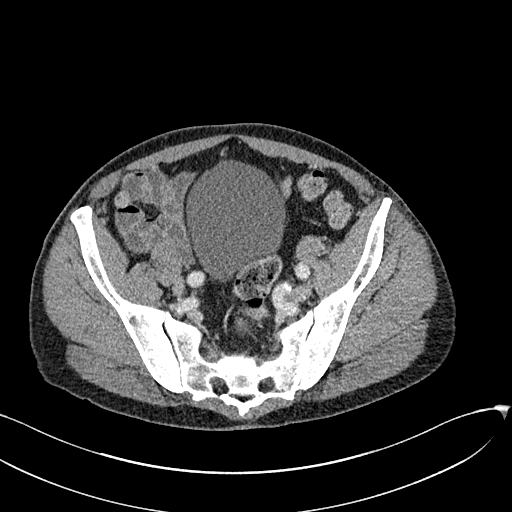
[im 141/175  soft-tissue]
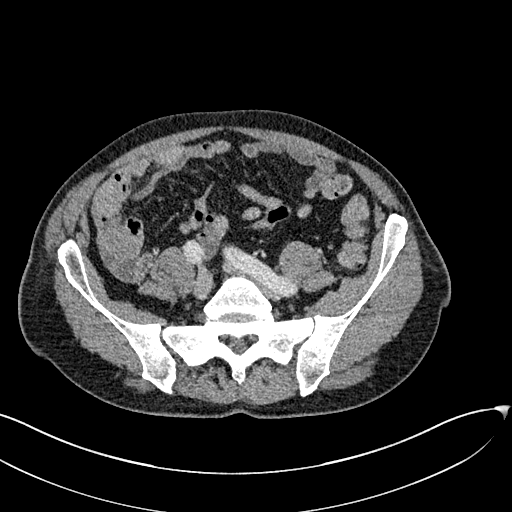
[im 152/175  soft-tissue]
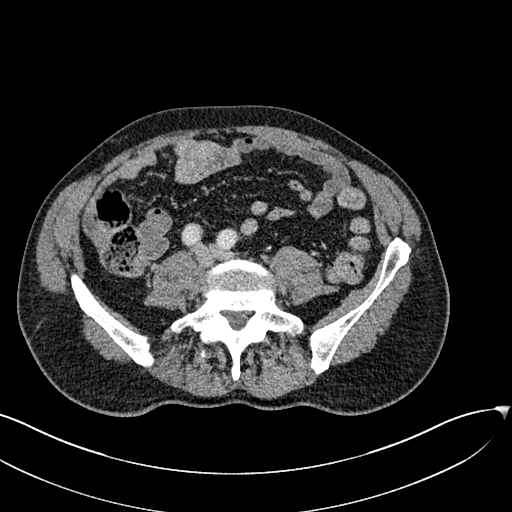
[im 163/175  soft-tissue]
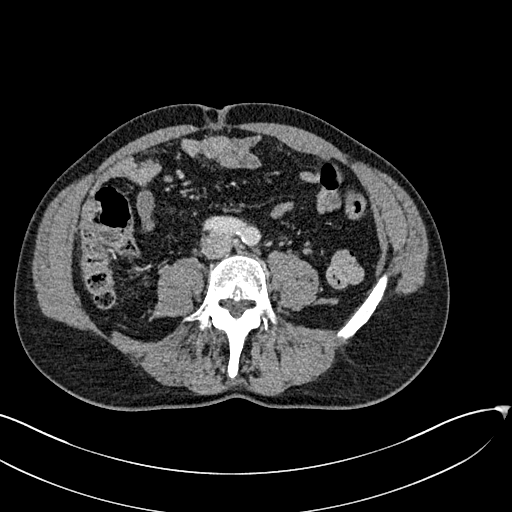

[Series 5: cor soft · coronal · 0.70mm/px · 3 of 135 slices shown]
[im 45/135  soft-tissue]
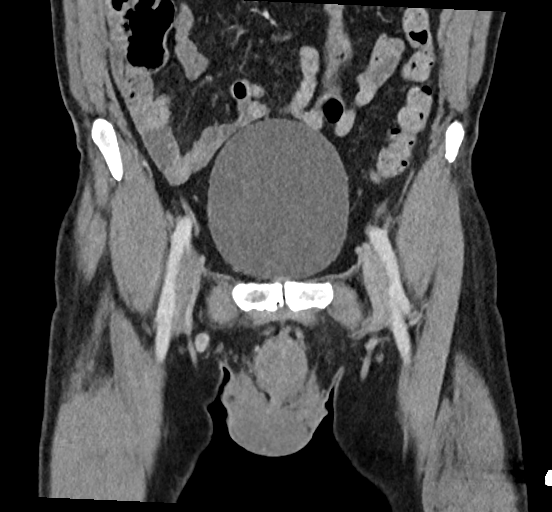
[im 60/135  soft-tissue]
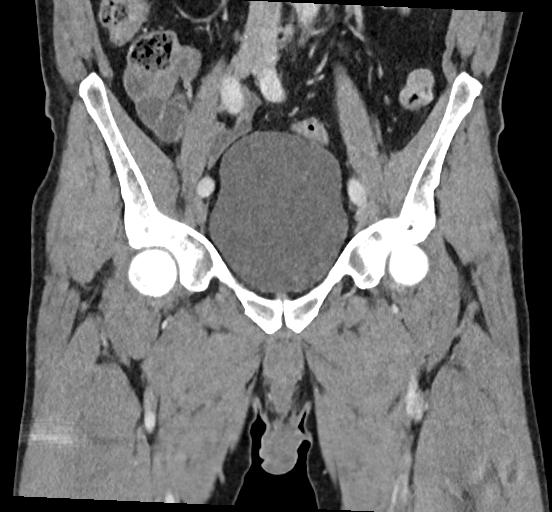
[im 75/135  soft-tissue]
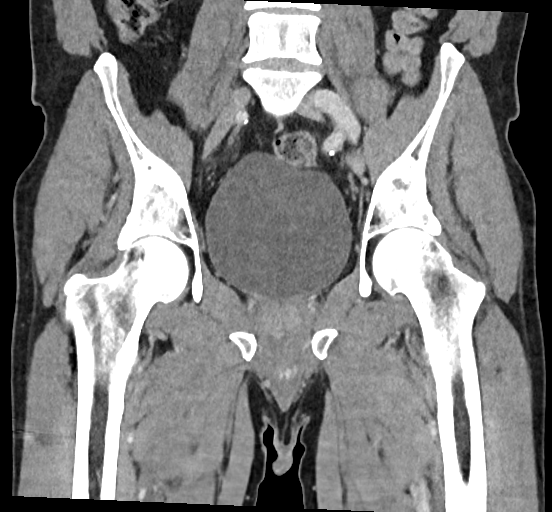

[16 of 46 positions shown; findings below may reference images not displayed]

FINDINGS: Urinary Tract:  No abnormality visualized.

Bowel: There is extensive inflammatory change involving the perianal
soft tissues which appear expanded secondary in keeping with a
perianal abscess. The fluid component appears multiloculated and
circumferentially encompasses the anus from the 12 o'clock to 9
o'clock position. The fluid component is difficult to accurately
measure because of its curvilinear configuration, however, this
measures at least 3.7 x 2.2 x 4.5 cm on axial image # 136 and
coronal reformat # 111. The inflammatory changes extend to the level
of the levator ani, however, but do not appear to extend into the
peritoneal cavity.

The visualized bowel is unremarkable. No evidence of obstruction. No
free intraperitoneal fluid within the pelvis.

Vascular/Lymphatic: Mild atherosclerotic calcification within the
iliac arteries. The abdominal vasculature is otherwise unremarkable.
No pathologic adenopathy within the abdomen and pelvis.

Reproductive: The prostate gland is unremarkable. Seminal vesicles
are unremarkable.

Other: Tiny bilateral fat containing inguinal hernias are present.
Tiny umbilical fat containing hernia noted.

Musculoskeletal: No acute bone abnormality. No lytic or blastic bone
lesion.
IMPRESSION: Extensive perianal inflammatory change with superimposed abscess
surrounding the anus from the 12 o'clock to 9 o'clock position
measuring at least 3.7 x 2.2 x 4.5 cm in greatest dimension.
Inflammatory changes extend up to the level of the levator ani
without extending superiorly.

## 2022-05-16 NOTE — Progress Notes (Signed)
Electrophysiology Office Note   Date:  05/16/2022   ID:  Barry [name], DOB Dec 14, 1955, MRN [id number]  PCP:  Daisy Floro, MD  Cardiologist:  Allyson Sabal Primary Electrophysiologist:  Stuart Guillen Jorja Loa, MD    No chief complaint on file.    History of Present Illness: Barry [name] is a 66 y.o. male who presents today for electrophysiology evaluation.     He has a history significant for atrial fibrillation, hypertension, hyperlipidemia.  He had a normal stress test and echo.  He is currently on metoprolol and flecainide.  He is not anticoagulated due to his low stroke risk.  Today, denies symptoms of palpitations, chest pain, shortness of breath, orthopnea, PND, lower extremity edema, claudication, dizziness, presyncope, syncope, bleeding, or neurologic sequela. The patient is tolerating medications without difficulties. ***    Past Medical History:  Diagnosis Date   Essential hypertension    H/O cardiovascular stress test    a. 12/2015 MV: EF 43% (nl by echo), small basal & mid inferoseptal defect - likely attenuation artifact.   Hyperlipidemia    Persistent atrial fibrillation (HCC)    a. Dx 12/2012; b. CHA2DS2VASc = 1-->Xarelto;  c. 12/2015 Echo: EF 50-55%, no rwma, mild AI, mildly dil Asc AO, triv MR/TR, sev dil LA, mild PI;  d. 02/2016 Successful DCCV w/ 629B; e. 04/2016 Recurrent AF.   Past Surgical History:  Procedure Laterality Date   CARDIOVERSION N/A 02/18/2016   Procedure: CARDIOVERSION;  Surgeon: Vesta Mixer, MD;  Location: [facility] ENDOSCOPY;  Service: Cardiovascular;  Laterality: N/A;   CARDIOVERSION N/A 06/22/2016   Procedure: CARDIOVERSION;  Surgeon: Vesta Mixer, MD;  Location[facility] ENDOSCOPY;  Service: Cardiovascular;  Laterality: N/A;   HERNIA REPAIR  1982     Current Outpatient Medications  Medication Sig Dispense Refill   b complex vitamins capsule Take 1 capsule by mouth daily.     Cholecalciferol (VITAMIN D3) 125 MCG (5000 UT) TABS Take 1 tablet  by mouth daily.     flecainide (TAMBOCOR) 100 MG tablet TAKE 1 TABLET BY MOUTH TWICE A DAY 60 tablet 4   HYDROcodone-acetaminophen (NORCO/VICODIN) 5-325 MG tablet Take 1 tablet by mouth every 6 (six) hours as needed for moderate pain. 20 tablet 0   ibuprofen (ADVIL,MOTRIN) 200 MG tablet Take 400-600 mg by mouth every 6 (six) hours as needed for moderate pain or headache.     metoprolol tartrate (LOPRESSOR) 25 MG tablet TAKE 1 TABLET BY MOUTH TWICE A DAY 60 tablet 4   naproxen sodium (ALEVE) 220 MG tablet Take 440 mg by mouth 2 (two) times daily as needed (pain).     rosuvastatin (CRESTOR) 20 MG tablet TAKE 1 TABLET BY MOUTH EVERY DAY 30 tablet 4   No current facility-administered medications for this visit.    Allergies:   Patient has no known allergies.   Social History:  The patient  reports that he has never smoked. He has never used smokeless tobacco. He reports current alcohol use of about 2.0 standard drinks of alcohol per week. He reports that he does not use drugs.   Family History:  The patient's family history includes Heart attack in his father and mother; Hyperlipidemia in his father; Hypertension in his father and mother.   ROS:  Please see the history of present illness.   Otherwise, review of systems is positive for none.   All other systems are reviewed and negative.   PHYSICAL EXAM: VS:  There were no vitals taken for  this visit. , BMI There is no height or weight on file to calculate BMI. GEN: Well nourished, well developed, in no acute distress  HEENT: normal  Neck: no JVD, carotid bruits, or masses Cardiac: ***RRR; no murmurs, rubs, or gallops,no edema  Respiratory:  clear to auscultation bilaterally, normal work of breathing GI: soft, nontender, nondistended, + BS MS: no deformity or atrophy  Skin: warm and dry Neuro:  Strength and sensation are intact Psych: euthymic mood, full affect  EKG:  EKG {ACTION; IS/IS [id number]} ordered today. Personal review of the  ekg ordered *** shows ***   Recent Labs: 06/14/2021: BUN 13; Creatinine, Ser 0.90; Hemoglobin 12.7; Platelets 195; Potassium 3.9; Sodium 139    Lipid Panel  No results found for: "CHOL", "TRIG", "HDL", "CHOLHDL", "VLDL", "LDLCALC", "LDLDIRECT"   Wt Readings from Last 3 Encounters:  06/12/21 190 lb 14.4 oz (86.6 kg)  11/06/20 189 lb 9.6 oz (86 kg)  05/08/20 189 lb (85.7 kg)      Other studies Reviewed: Additional studies/ records that were reviewed today include: TTE 01/02/16 - Left ventricle: The cavity size was normal. There was moderate   concentric hypertrophy. Systolic function was normal. The   estimated ejection fraction was in the range of 50% to 55%. Wall   motion was normal; there were no regional wall motion   abnormalities. - Aortic valve: Transvalvular velocity was within the normal range.   There was no stenosis. There was mild regurgitation. - Aorta: Ascending aorta diameter: 38 mm (ED). - Ascending aorta: The ascending aorta was mildly dilated. - Mitral valve: Transvalvular velocity was within the normal range.   There was no evidence for stenosis. There was trivial   regurgitation. - Left atrium: The atrium was severely dilated. - Right ventricle: Systolic function was normal. - Tricuspid valve: There was trivial regurgitation. - Pulmonic valve: There was mild regurgitation. - Inferior vena cava: The vessel was normal in size. The   respirophasic diameter changes were in the normal range (>= 50%),   consistent with normal central venous pressure.  Myoview 01/02/16  The left ventricular ejection fraction is moderately decreased (30-44%). Nuclear stress EF is calculated at 43% but visually appears higher. There was no ST segment deviation noted during stress. There is a small defect of mild severity present in the basal inferoseptal and mid inferoseptal location. The defect is non-reversible. There is a small defect of mild severity present in the basal inferior  and mid inferior location. The defect is non-reversible. Defects are most consistent with diaphragmatic attenuation. No ischemia noted.   ASSESSMENT AND PLAN:  1.  Persistent atrial fibrillation: Currently on flecainide 100 mg twice daily, metoprolol 25 mg twice daily.  CHA2DS2-VASc of 1 and thus not anticoagulated.  High risk medication monitoring for flecainide.***  2.  Hypertension:***  3.  Hyperlipidemia: Continue Crestor per primary physician   Current medicines are reviewed at length with the patient today.   The patient does not have concerns regarding his medicines.  The following changes were made today: ***  Labs/ tests ordered today include:  No orders of the defined types were placed in this encounter.    Disposition:   FU with Javarri Segal *** months  Signed, Enyla Lisbon Jorja Loa, MD  05/16/2022 3:52 PM     [facility] [address] [place] [address] [phone] (office) [phone] (fax)

## 2022-05-17 ENCOUNTER — Encounter: Payer: Self-pay | Admitting: Cardiology

## 2022-05-17 ENCOUNTER — Ambulatory Visit (INDEPENDENT_AMBULATORY_CARE_PROVIDER_SITE_OTHER): Payer: Managed Care, Other (non HMO) | Admitting: Cardiology

## 2022-05-17 VITALS — BP 126/78 | HR 70 | Ht 73.0 in | Wt 187.6 lb

## 2022-05-17 DIAGNOSIS — I480 Paroxysmal atrial fibrillation: Secondary | ICD-10-CM | POA: Diagnosis not present

## 2022-06-02 ENCOUNTER — Other Ambulatory Visit: Payer: Self-pay | Admitting: Cardiology

## 2023-05-18 ENCOUNTER — Other Ambulatory Visit: Payer: Self-pay | Admitting: Cardiology

## 2023-06-19 ENCOUNTER — Other Ambulatory Visit: Payer: Self-pay | Admitting: Cardiology

## 2023-07-11 ENCOUNTER — Other Ambulatory Visit: Payer: Self-pay | Admitting: Cardiology

## 2023-08-25 ENCOUNTER — Ambulatory Visit: Payer: Managed Care, Other (non HMO) | Admitting: Cardiology

## 2023-09-15 NOTE — Progress Notes (Signed)
  Electrophysiology Office Note:   Date:  09/16/2023  ID:  Barry [name], DOB 17-Feb-1956, MRN [id number]  Primary Cardiologist: None Electrophysiologist: Will Jorja Loa, MD      History of Present Illness:   Barry [name] is a 67 y.o. male with h/o PAF, HTN, and HLD seen today for routine electrophysiology followup.   Since last being seen in our clinic the patient reports doing very well. Monitors HR/rhythm with a fitbit and Kardia. Hasn't not had any AF.  he denies chest pain, palpitations, dyspnea, PND, orthopnea, nausea, vomiting, dizziness, syncope, edema, weight gain, or early satiety.   Review of systems complete and found to be negative unless listed in HPI.   EP Information / Studies Reviewed:    EKG is ordered today. Personal review as below.  EKG Interpretation Date/Time:  Friday September 16 2023 08:32:54 EST Ventricular Rate:  61 PR Interval:  198 QRS Duration:  112 QT Interval:  428 QTC Calculation: 430 R Axis:   73  Text Interpretation: Normal sinus rhythm Nonspecific ST abnormality Confirmed by Maxine Glenn [phone]) on 09/16/2023 8:39:13 AM    Echo 12/2022 LVEF 50-55%, severe LAE, trivial TR, mild PI  Physical Exam:   VS:  BP 122/68   Pulse 61   Ht 6\' 1"  (1.854 m)   Wt 185 lb (83.9 kg)   SpO2 95%   BMI 24.41 kg/m    Wt Readings from Last 3 Encounters:  09/16/23 185 lb (83.9 kg)  05/17/22 187 lb 9.6 oz (85.1 kg)  06/12/21 190 lb 14.4 oz (86.6 kg)     GEN: Well nourished, well developed in no acute distress NECK: No JVD; No carotid bruits CARDIAC: Regular rate and rhythm, no murmurs, rubs, gallops RESPIRATORY:  Clear to auscultation without rales, wheezing or rhonchi  ABDOMEN: Soft, non-tender, non-distended EXTREMITIES:  No edema; No deformity   ASSESSMENT AND PLAN:    Persistent atrial fibrillation EKG today shows NSR with stable intervals Continue flecainide 100 mg BId Continue toprol 25 mg BID Not on OAC with CHA2DS2VASc  of 1 ->  Will increase to 2 next month. Long discussion. Would prefer to continue monitoring with wearabls and Kardia. Will return in 6 mo to discuss feasibility with MD  HTN Stable on current regimen   HLD Continue statin    Follow up with Dr. Elberta Fortis in 6 months  Signed, Graciella Freer, PA-C

## 2023-09-16 ENCOUNTER — Ambulatory Visit: Payer: Managed Care, Other (non HMO) | Attending: Cardiology | Admitting: Student

## 2023-09-16 ENCOUNTER — Encounter: Payer: Self-pay | Admitting: Student

## 2023-09-16 VITALS — BP 122/68 | HR 61 | Ht 73.0 in | Wt 185.0 lb

## 2023-09-16 DIAGNOSIS — E785 Hyperlipidemia, unspecified: Secondary | ICD-10-CM

## 2023-09-16 DIAGNOSIS — I480 Paroxysmal atrial fibrillation: Secondary | ICD-10-CM | POA: Diagnosis not present

## 2023-09-16 DIAGNOSIS — I10 Essential (primary) hypertension: Secondary | ICD-10-CM | POA: Diagnosis not present

## 2023-09-16 NOTE — Patient Instructions (Signed)
Medication Instructions:  Your physician recommends that you continue on your current medications as directed. Please refer to the Current Medication list given to you today.  *If you need a refill on your cardiac medications before your next appointment, please call your pharmacy*  Lab Work: None ordered If you have labs (blood work) drawn today and your tests are completely normal, you will receive your results only by: MyChart Message (if you have MyChart) OR A paper copy in the mail If you have any lab test that is abnormal or we need to change your treatment, we will call you to review the results.  Follow-Up: At [facility], you and your health needs are our priority.  As part of our continuing mission to provide you with exceptional heart care, we have created designated Provider Care Teams.  These Care Teams include your primary Cardiologist (physician) and Advanced Practice Providers (APPs -  Physician Assistants and Nurse Practitioners) who all work together to provide you with the care you need, when you need it.  Your next appointment:   6 month(s)  Provider:   Loman Brooklyn, MD only to discuss blood thinner

## 2023-10-16 ENCOUNTER — Other Ambulatory Visit: Payer: Self-pay | Admitting: Cardiology

## 2024-03-15 NOTE — Progress Notes (Signed)
  Electrophysiology Office Note:   Date:  03/16/2024  ID:  Barry [name], DOB 28-Aug-1956, MRN [id number]  Primary Cardiologist: None Electrophysiologist: Will Cortland Ding, MD      History of Present Illness:   Barry [name] is a 68 y.o. male with h/o PAF, HTN, and HLD seen today for routine electrophysiology followup.   Since last being seen in our clinic the patient reports doing very well. Monitors his HR and rhythm with Fitbit, Kardia Mobile, and BP cuff. Overall,.  he denies chest pain, dyspnea, PND, orthopnea, nausea, vomiting, dizziness, syncope, edema, weight gain, or early satiety.  He has rare palpitations that are usually short lived.   Review of systems complete and found to be negative unless listed in HPI.   EP Information / Studies Reviewed:    EKG is ordered today. Personal review as below.  EKG Interpretation Date/Time:  Friday Mar 16 2024 08:18:03 EDT Ventricular Rate:  65 PR Interval:  198 QRS Duration:  104 QT Interval:  418 QTC Calculation: 434 R Axis:   43  Text Interpretation: Normal sinus rhythm Nonspecific ST and T wave abnormality When compared with ECG of 16-Sep-2023 08:32, No significant change was found Confirmed by Pilar Bridge [phone]) on 03/16/2024 8:21:42 AM    Arrhythmia/Device History No specialty comments available.   Physical Exam:   VS:  BP 112/66   Pulse 65   Ht 6\' 1"  (1.854 m)   Wt 186 lb (84.4 kg)   SpO2 94%   BMI 24.54 kg/m    Wt Readings from Last 3 Encounters:  03/16/24 186 lb (84.4 kg)  09/16/23 185 lb (83.9 kg)  05/17/22 187 lb 9.6 oz (85.1 kg)     GEN: No acute distress NECK: No JVD; No carotid bruits CARDIAC: Regular rate and rhythm, no murmurs, rubs, gallops RESPIRATORY:  Clear to auscultation without rales, wheezing or rhonchi  ABDOMEN: Soft, non-tender, non-distended EXTREMITIES:  No edema; No deformity   ASSESSMENT AND PLAN:    Paroxysmal AF EKG today shows NSR with stable intervals Continue  flecainide  100 mg BID Continue toprol  25 mg BID CHA2DS2/VASc is 2 now that he is 67. Discussed OAC having previously reviewed with Dr. Lawana Pray. In shared decision making will continue to monitor his rhythm with wearables . If he develops recurrent AF will need to go on OAC while we discuss plan (alternate AAD vs Ablation)  HTN Stable on current regimen   HLD Continue statin    Follow up with Dr. Lawana Pray in 6 months  Signed, Tylene Galla, PA-C

## 2024-03-16 ENCOUNTER — Encounter: Payer: Self-pay | Admitting: Student

## 2024-03-16 ENCOUNTER — Ambulatory Visit: Attending: Student | Admitting: Student

## 2024-03-16 VITALS — BP 112/66 | HR 65 | Ht 73.0 in | Wt 186.0 lb

## 2024-03-16 DIAGNOSIS — E785 Hyperlipidemia, unspecified: Secondary | ICD-10-CM

## 2024-03-16 DIAGNOSIS — I10 Essential (primary) hypertension: Secondary | ICD-10-CM | POA: Diagnosis not present

## 2024-03-16 DIAGNOSIS — I480 Paroxysmal atrial fibrillation: Secondary | ICD-10-CM

## 2024-03-16 NOTE — Patient Instructions (Signed)
 Medication Instructions:  Your physician recommends that you continue on your current medications as directed. Please refer to the Current Medication list given to you today.  *If you need a refill on your cardiac medications before your next appointment, please call your pharmacy*  Lab Work: None ordered If you have labs (blood work) drawn today and your tests are completely normal, you will receive your results only by: MyChart Message (if you have MyChart) OR A paper copy in the mail If you have any lab test that is abnormal or we need to change your treatment, we will call you to review the results.  Follow-Up: At [facility], you and your health needs are our priority.  As part of our continuing mission to provide you with exceptional heart care, our providers are all part of one team.  This team includes your primary Cardiologist (physician) and Advanced Practice Providers or APPs (Physician Assistants and Nurse Practitioners) who all work together to provide you with the care you need, when you need it.  Your next appointment:   6 month(s)  Provider:   Agatha Horsfall, MD or Joycelyn Noa" [place], PA-C

## 2024-04-25 ENCOUNTER — Other Ambulatory Visit: Payer: Self-pay | Admitting: Cardiology

## 2024-10-01 MED ORDER — ROSUVASTATIN CALCIUM 20 MG PO TABS: 20.0000 mg | ORAL_TABLET | Freq: Every day | ORAL | 3 refills | Status: AC

## 2024-11-09 ENCOUNTER — Telehealth: Payer: Self-pay | Admitting: Cardiology

## 2024-11-09 NOTE — Telephone Encounter (Signed)
" °*  STAT* If patient is at the pharmacy, call can be transferred to refill team.   1. Which medications need to be refilled? (please list name of each medication and dose if known)   flecainide  (TAMBOCOR ) 100 MG tablet     2. Would you like to learn more about the convenience, safety, & potential cost savings by using the [facility]? No    3. Are you open to using the [facility] (Type [facility]. No    4. Which pharmacy/location (including street and city if local pharmacy) is medication to be sent to?[id number] - [place], [place] - [address] AT [address] OF [facility] [address]    5. Do they need a 30 day or 90 day supply? 30 day   "

## 2024-11-13 MED ORDER — FLECAINIDE ACETATE 100 MG PO TABS: 100.0000 mg | ORAL_TABLET | Freq: Two times a day (BID) | ORAL | 0 refills | Status: DC

## 2024-11-13 NOTE — Telephone Encounter (Signed)
 Pt scheduled to see Jodie Passey, NP, 12/07/2024.  Refill sent.

## 2024-12-06 NOTE — Progress Notes (Signed)
" °  Electrophysiology Office Note:   Date:  12/07/2024  ID:  ALIX STOWERS, DOB 1955-12-21, MRN [id number]  Primary Cardiologist: None Electrophysiologist: Will Gladis Norton, MD   Electrophysiologist:  Soyla Gladis Norton, MD      History of Present Illness:   Barry [name] is a 69 y.o. male with h/o PAF, HTN, and HLD seen today for routine electrophysiology followup.   Since last being seen in our clinic the patient reports doing very well. Monitors his HR with Kardia mobile and fitbit. No breakthrough arrhythmia of which he is aware. Otherwise, he denies chest pain, palpitations, dyspnea, PND, orthopnea, nausea, vomiting, dizziness, syncope, edema, weight gain, or early satiety.   Review of systems complete and found to be negative unless listed in HPI.   EP Information / Studies Reviewed:    EKG is ordered today. Personal review as below.  EKG Interpretation Date/Time:  Friday December 07 2024 08:08:00 EST Ventricular Rate:  62 PR Interval:  194 QRS Duration:  108 QT Interval:  418 QTC Calculation: 424 R Axis:   65  Text Interpretation: Normal sinus rhythm Normal ECG When compared with ECG of 16-Mar-2024 08:18, No significant change was found Confirmed by Lesia Heck ([id number]) on 12/07/2024 8:12:40 AM    Arrhythmia/Device History No specialty comments available.   Physical Exam:   VS:  BP 134/80   Pulse 62   Ht 6' 1 (1.854 m)   Wt 189 lb 12.8 oz (86.1 kg)   BMI 25.04 kg/m    Wt Readings from Last 3 Encounters:  12/07/24 189 lb 12.8 oz (86.1 kg)  03/16/24 186 lb (84.4 kg)  09/16/23 185 lb (83.9 kg)     GEN: No acute distress NECK: No JVD; No carotid bruits CARDIAC: Regular rate and rhythm, no murmurs, rubs, gallops RESPIRATORY:  Clear to auscultation without rales, wheezing or rhonchi  ABDOMEN: Soft, non-tender, non-distended EXTREMITIES:  No edema; No deformity   ASSESSMENT AND PLAN:    Paroxysmal AF EKG today shows NSR with stable intervals Continue  flecainide  100 mg BID Continue toprol  25 mg BID CHA2DS2/VASc is 2 now that he is 67. Discussed OAC having previously reviewed with Dr. Norton. In shared decision making will continue to monitor his rhythm with wearables . If he develops recurrent AF will need to go on OAC while we discuss plan (alternate AAD vs Ablation)   HTN Stable on current regimen   HLD Continue statin   Follow up with Dr. Norton in 6 months  Signed, Ozell Prentice Lesia, PA-C  "

## 2024-12-07 ENCOUNTER — Encounter: Payer: Self-pay | Admitting: Student

## 2024-12-07 ENCOUNTER — Ambulatory Visit: Attending: Student | Admitting: Student

## 2024-12-07 VITALS — BP 134/80 | HR 62 | Ht 73.0 in | Wt 189.8 lb

## 2024-12-07 DIAGNOSIS — I10 Essential (primary) hypertension: Secondary | ICD-10-CM

## 2024-12-07 DIAGNOSIS — D6869 Other thrombophilia: Secondary | ICD-10-CM

## 2024-12-07 DIAGNOSIS — I480 Paroxysmal atrial fibrillation: Secondary | ICD-10-CM

## 2024-12-07 NOTE — Patient Instructions (Signed)
 Medication Instructions:  No medication changes today. *If you need a refill on your cardiac medications before your next appointment, please call your pharmacy*  Lab Work: No labwork ordered today. If you have labs (blood work) drawn today and your tests are completely normal, you will receive your results only by: MyChart Message (if you have MyChart) OR A paper copy in the mail If you have any lab test that is abnormal or we need to change your treatment, we will call you to review the results.  Testing/Procedures: No testing ordered today  Follow-Up: At [facility], you and your health needs are our priority.  As part of our continuing mission to provide you with exceptional heart care, our providers are all part of one team.  This team includes your primary Cardiologist (physician) and Advanced Practice Providers or APPs (Physician Assistants and Nurse Practitioners) who all work together to provide you with the care you need, when you need it.  Your next appointment:   6 month(s)  Provider:   Will Gladis Norton, MD   We recommend signing up for the patient portal called MyChart.  Sign up information is provided on this After Visit Summary.  MyChart is used to connect with patients for Virtual Visits (Telemedicine).  Patients are able to view lab/test results, encounter notes, upcoming appointments, etc.  Non-urgent messages can be sent to your provider as well.   To learn more about what you can do with MyChart, go to [email or web address].

## 2024-12-09 ENCOUNTER — Other Ambulatory Visit: Payer: Self-pay | Admitting: Student

## 2024-12-12 NOTE — Telephone Encounter (Signed)
 Overdue Labs. Refill Sent in  In accordance with refill protocols, please review and address the following requirements before this medication refill can be authorized:  Labs
